# Patient Record
Sex: Female | Born: 1970 | Race: Black or African American | Hispanic: No | Marital: Married | Smoking: Never smoker
Health system: Southern US, Community
[De-identification: ages and names within clinical notes are randomized; demographics above are authoritative.]

## PROBLEM LIST (undated history)

## (undated) DIAGNOSIS — D508 Other iron deficiency anemias: Secondary | ICD-10-CM

## (undated) DIAGNOSIS — I10 Essential (primary) hypertension: Secondary | ICD-10-CM

## (undated) DIAGNOSIS — D649 Anemia, unspecified: Secondary | ICD-10-CM

## (undated) HISTORY — PX: WISDOM TOOTH EXTRACTION: SHX21

## (undated) HISTORY — DX: Other iron deficiency anemias: D50.8

## (undated) HISTORY — PX: OTHER SURGICAL HISTORY: SHX169

## (undated) HISTORY — PX: LAPAROSCOPIC HYSTERECTOMY: SHX1926

---

## 1999-05-08 ENCOUNTER — Inpatient Hospital Stay (HOSPITAL_COMMUNITY): Admission: AD | Admit: 1999-05-08 | Discharge: 1999-05-10 | Payer: Self-pay | Admitting: Obstetrics

## 1999-05-14 ENCOUNTER — Encounter (HOSPITAL_COMMUNITY): Admission: RE | Admit: 1999-05-14 | Discharge: 1999-08-12 | Payer: Self-pay | Admitting: *Deleted

## 2001-04-02 ENCOUNTER — Inpatient Hospital Stay (HOSPITAL_COMMUNITY): Admission: AD | Admit: 2001-04-02 | Discharge: 2001-04-04 | Payer: Self-pay | Admitting: Obstetrics and Gynecology

## 2001-04-06 ENCOUNTER — Encounter: Admission: RE | Admit: 2001-04-06 | Discharge: 2001-05-06 | Payer: Self-pay | Admitting: Obstetrics and Gynecology

## 2001-05-10 ENCOUNTER — Other Ambulatory Visit: Admission: RE | Admit: 2001-05-10 | Discharge: 2001-05-10 | Payer: Self-pay | Admitting: Obstetrics and Gynecology

## 2001-12-28 ENCOUNTER — Encounter: Payer: Self-pay | Admitting: Emergency Medicine

## 2001-12-28 ENCOUNTER — Encounter: Admission: RE | Admit: 2001-12-28 | Discharge: 2001-12-28 | Payer: Self-pay | Admitting: Emergency Medicine

## 2002-05-25 ENCOUNTER — Other Ambulatory Visit: Admission: RE | Admit: 2002-05-25 | Discharge: 2002-05-25 | Payer: Self-pay | Admitting: Obstetrics and Gynecology

## 2002-11-21 ENCOUNTER — Other Ambulatory Visit: Admission: RE | Admit: 2002-11-21 | Discharge: 2002-11-21 | Payer: Self-pay | Admitting: Obstetrics and Gynecology

## 2003-05-27 ENCOUNTER — Encounter: Payer: Self-pay | Admitting: Obstetrics and Gynecology

## 2003-05-27 ENCOUNTER — Inpatient Hospital Stay (HOSPITAL_COMMUNITY): Admission: AD | Admit: 2003-05-27 | Discharge: 2003-05-31 | Payer: Self-pay | Admitting: *Deleted

## 2003-05-28 ENCOUNTER — Encounter (INDEPENDENT_AMBULATORY_CARE_PROVIDER_SITE_OTHER): Payer: Self-pay | Admitting: Specialist

## 2003-12-23 HISTORY — PX: TUBAL LIGATION: SHX77

## 2003-12-23 HISTORY — PX: ABDOMINOPLASTY: SUR9

## 2003-12-23 HISTORY — PX: OTHER SURGICAL HISTORY: SHX169

## 2004-05-17 ENCOUNTER — Inpatient Hospital Stay (HOSPITAL_COMMUNITY): Admission: AD | Admit: 2004-05-17 | Discharge: 2004-05-17 | Payer: Self-pay | Admitting: Obstetrics and Gynecology

## 2004-05-22 ENCOUNTER — Other Ambulatory Visit: Admission: RE | Admit: 2004-05-22 | Discharge: 2004-05-22 | Payer: Self-pay | Admitting: Obstetrics and Gynecology

## 2004-05-22 ENCOUNTER — Other Ambulatory Visit: Admission: RE | Admit: 2004-05-22 | Discharge: 2004-05-22 | Payer: Self-pay | Admitting: Advanced Practice Midwife

## 2004-06-18 ENCOUNTER — Ambulatory Visit (HOSPITAL_COMMUNITY): Admission: RE | Admit: 2004-06-18 | Discharge: 2004-06-18 | Payer: Self-pay | Admitting: Obstetrics and Gynecology

## 2004-07-01 ENCOUNTER — Encounter (HOSPITAL_COMMUNITY): Admission: RE | Admit: 2004-07-01 | Discharge: 2004-07-31 | Payer: Self-pay | Admitting: Obstetrics and Gynecology

## 2004-07-09 ENCOUNTER — Ambulatory Visit (HOSPITAL_COMMUNITY): Admission: RE | Admit: 2004-07-09 | Discharge: 2004-07-09 | Payer: Self-pay | Admitting: Obstetrics and Gynecology

## 2004-08-05 ENCOUNTER — Encounter (HOSPITAL_COMMUNITY): Admission: AD | Admit: 2004-08-05 | Discharge: 2004-09-04 | Payer: Self-pay | Admitting: Obstetrics and Gynecology

## 2004-08-26 ENCOUNTER — Ambulatory Visit (HOSPITAL_COMMUNITY): Admission: RE | Admit: 2004-08-26 | Discharge: 2004-08-26 | Payer: Self-pay | Admitting: Obstetrics and Gynecology

## 2004-09-10 ENCOUNTER — Encounter (HOSPITAL_COMMUNITY): Admission: AD | Admit: 2004-09-10 | Discharge: 2004-09-20 | Payer: Self-pay | Admitting: Obstetrics and Gynecology

## 2004-09-10 ENCOUNTER — Inpatient Hospital Stay (HOSPITAL_COMMUNITY): Admission: AD | Admit: 2004-09-10 | Discharge: 2004-09-10 | Payer: Self-pay | Admitting: Obstetrics and Gynecology

## 2004-09-11 ENCOUNTER — Inpatient Hospital Stay (HOSPITAL_COMMUNITY): Admission: AD | Admit: 2004-09-11 | Discharge: 2004-09-15 | Payer: Self-pay | Admitting: Obstetrics and Gynecology

## 2004-09-13 ENCOUNTER — Ambulatory Visit: Payer: Self-pay | Admitting: Neonatology

## 2004-09-23 ENCOUNTER — Encounter (HOSPITAL_COMMUNITY): Admission: RE | Admit: 2004-09-23 | Discharge: 2004-09-30 | Payer: Self-pay | Admitting: Obstetrics and Gynecology

## 2004-09-23 ENCOUNTER — Ambulatory Visit (HOSPITAL_COMMUNITY): Admission: RE | Admit: 2004-09-23 | Discharge: 2004-09-23 | Payer: Self-pay | Admitting: Obstetrics and Gynecology

## 2004-10-02 ENCOUNTER — Inpatient Hospital Stay (HOSPITAL_COMMUNITY): Admission: AD | Admit: 2004-10-02 | Discharge: 2004-10-08 | Payer: Self-pay | Admitting: Obstetrics and Gynecology

## 2004-10-05 ENCOUNTER — Encounter (INDEPENDENT_AMBULATORY_CARE_PROVIDER_SITE_OTHER): Payer: Self-pay | Admitting: Specialist

## 2004-11-13 ENCOUNTER — Other Ambulatory Visit: Admission: RE | Admit: 2004-11-13 | Discharge: 2004-11-13 | Payer: Self-pay | Admitting: Obstetrics and Gynecology

## 2004-11-26 ENCOUNTER — Ambulatory Visit (HOSPITAL_COMMUNITY): Admission: RE | Admit: 2004-11-26 | Discharge: 2004-11-26 | Payer: Self-pay | Admitting: Obstetrics and Gynecology

## 2004-12-10 ENCOUNTER — Ambulatory Visit (HOSPITAL_COMMUNITY): Admission: RE | Admit: 2004-12-10 | Discharge: 2004-12-10 | Payer: Self-pay | Admitting: Obstetrics and Gynecology

## 2005-01-08 ENCOUNTER — Ambulatory Visit: Payer: Self-pay | Admitting: Hematology & Oncology

## 2005-02-28 ENCOUNTER — Ambulatory Visit: Payer: Self-pay | Admitting: Hematology & Oncology

## 2005-05-23 ENCOUNTER — Ambulatory Visit: Payer: Self-pay | Admitting: Hematology & Oncology

## 2005-09-11 ENCOUNTER — Ambulatory Visit: Payer: Self-pay | Admitting: Hematology & Oncology

## 2006-04-09 IMAGING — US US OB FOLLOW-UP
1 series · 13 of 28 positions shown · non-contrast
Comparison: none

CLINICAL DATA: 32-year-old.  G4 P3 with assigned gestational age of 24 weeks 1 day based on LMP.

[Series 1: unknown · 0.30mm/px · 13 of 51 slices shown]
[im 2/51]
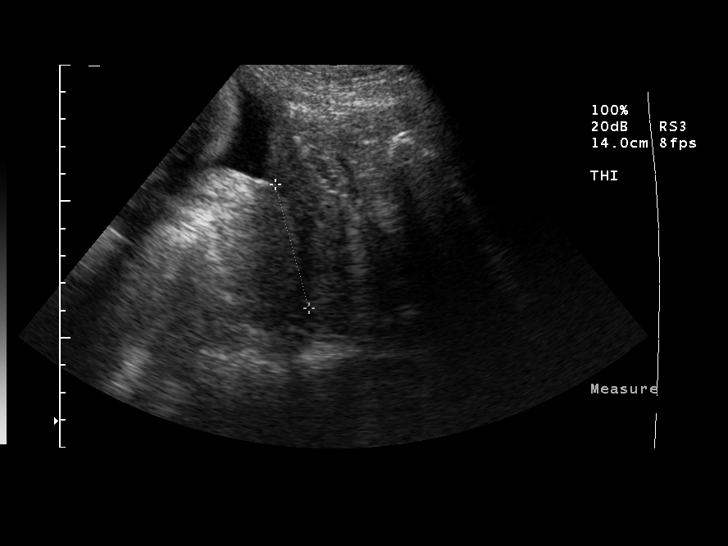
[im 6/51]
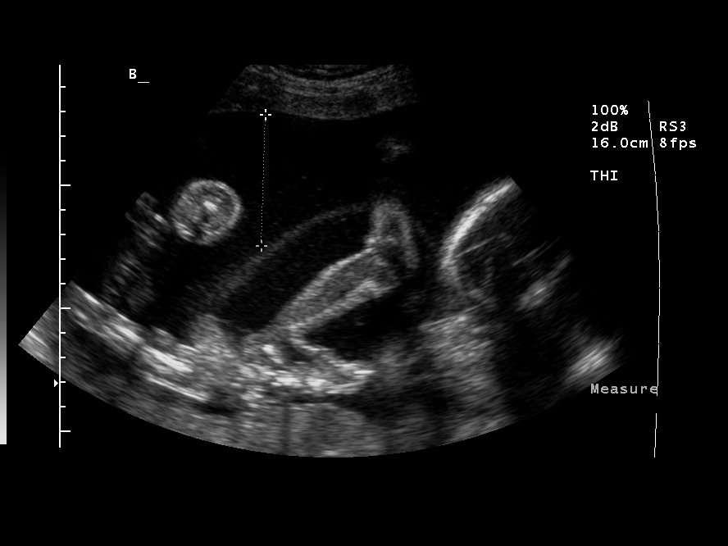
[im 10/51]
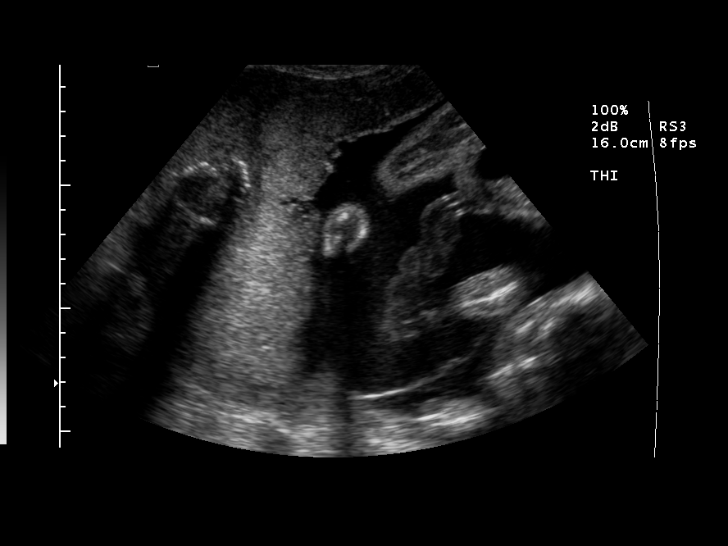
[im 13/51]
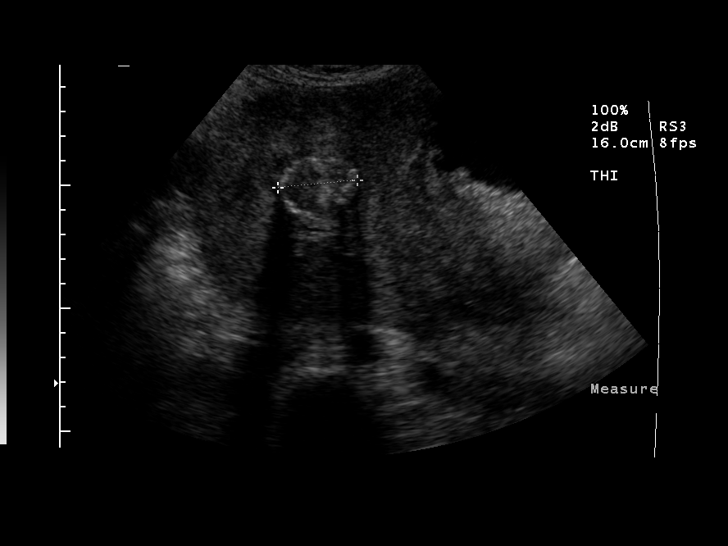
[im 17/51]
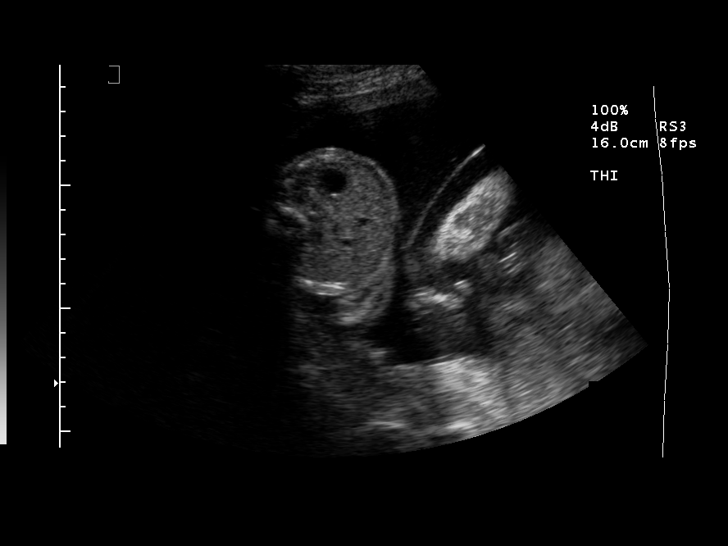
[im 21/51]
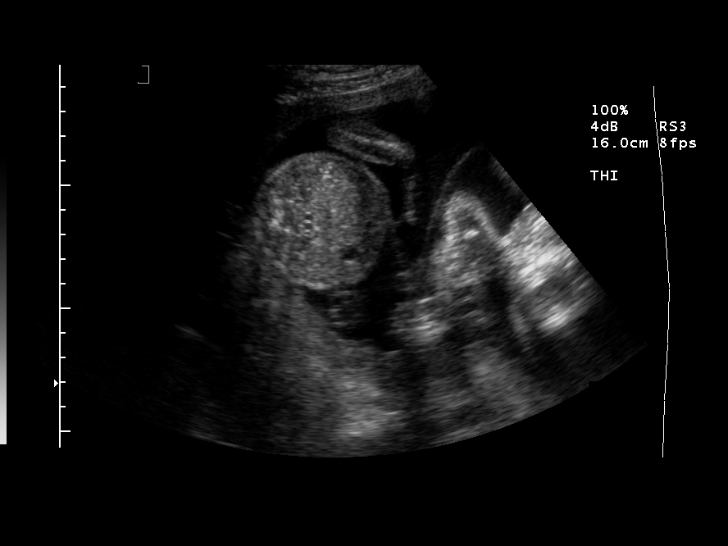
[im 26/51]
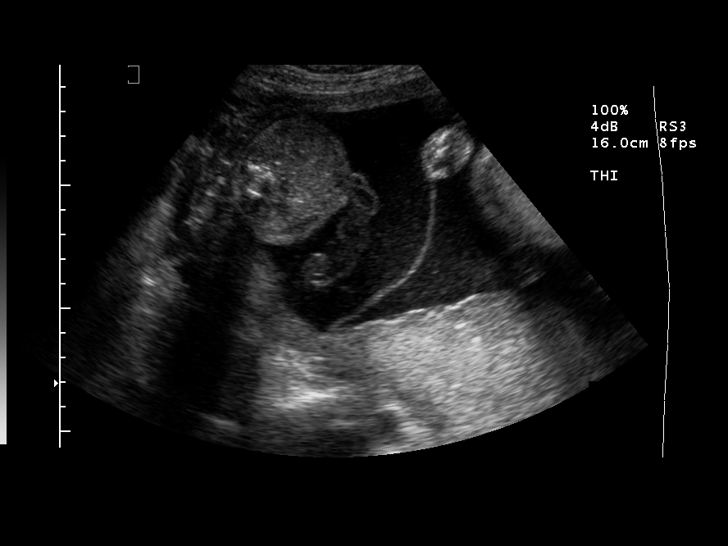
[im 30/51]
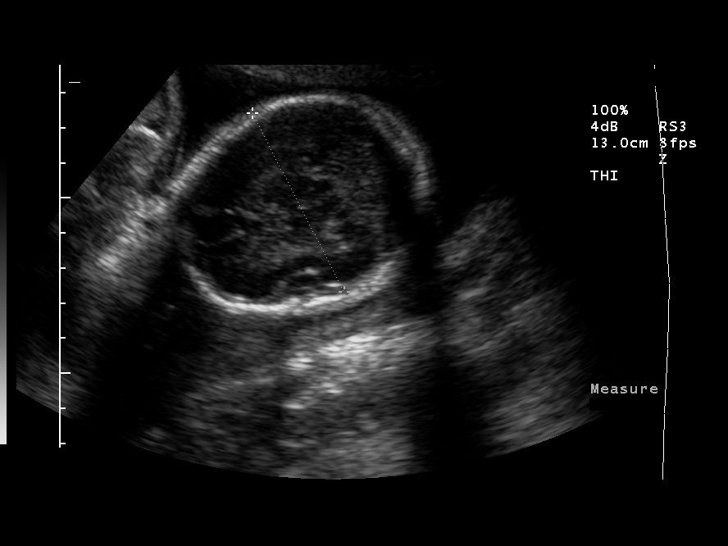
[im 34/51]
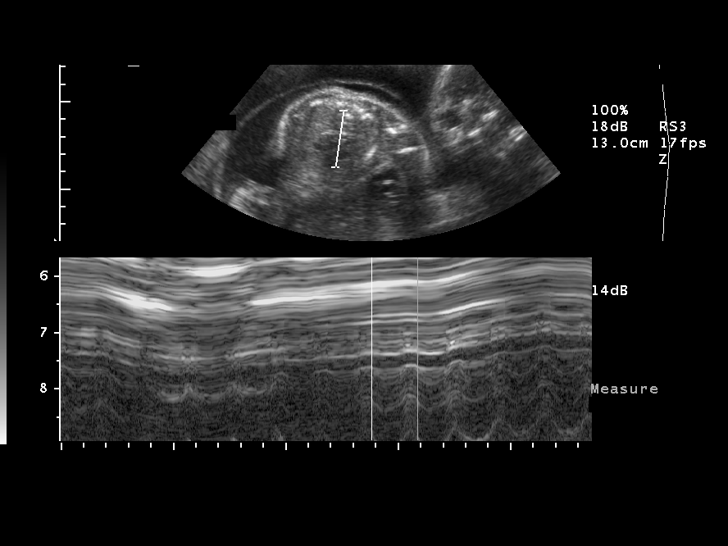
[im 38/51]
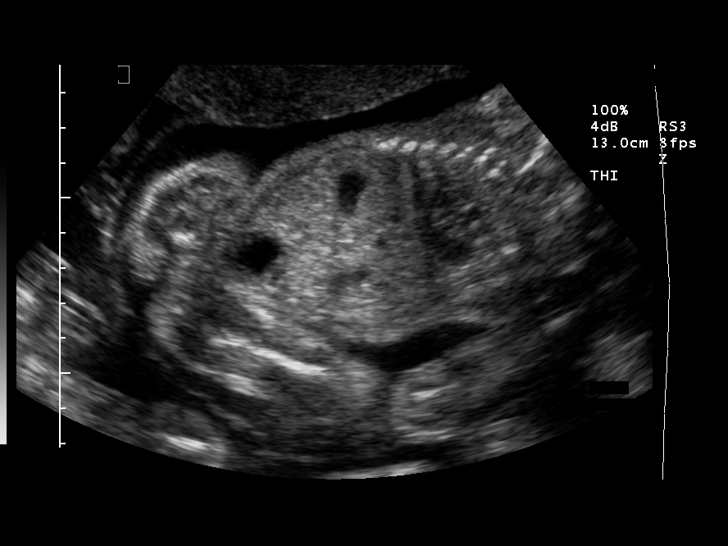
[im 41/51]
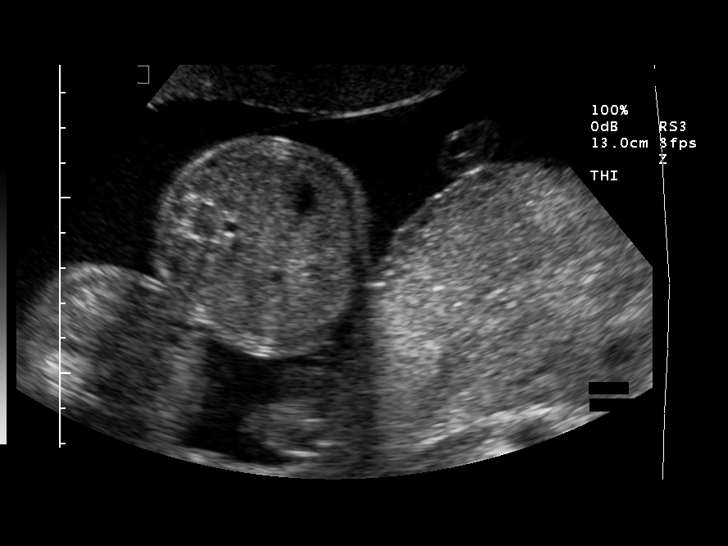
[im 45/51]
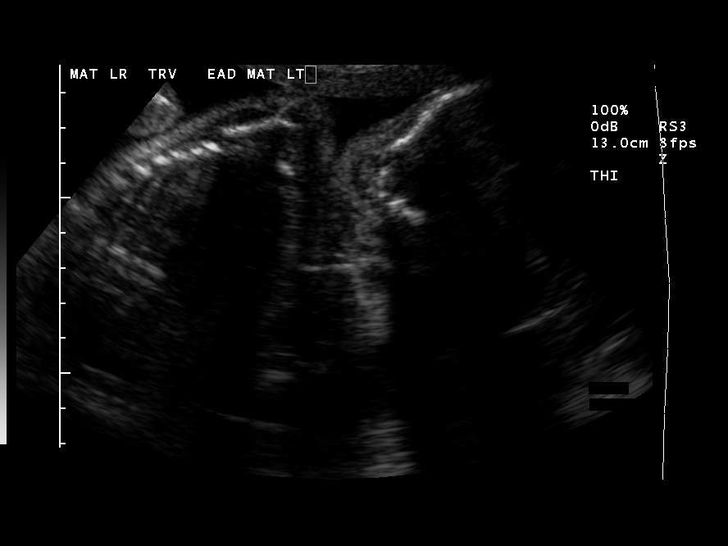
[im 49/51]
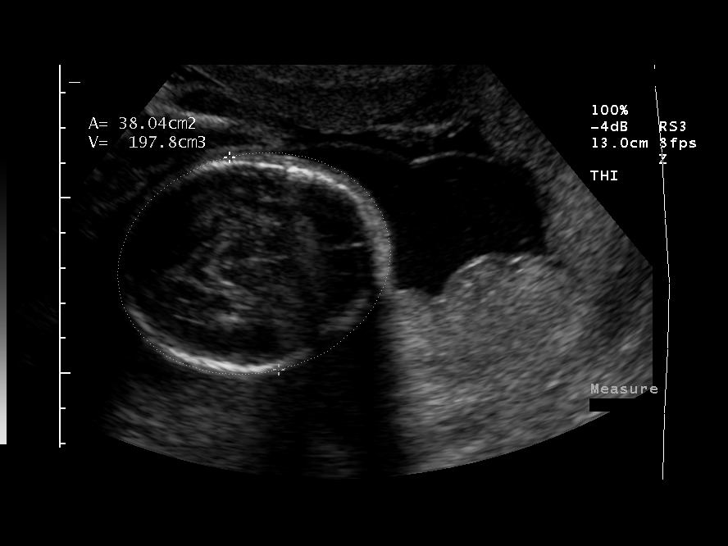

[13 of 28 positions shown; findings below may reference images not displayed]

TWIN OBSTETRICAL ULTRASOUND REEVALUATION:
 A living twin gestation is present.  A separating membrane is seen.

 TWIN A:
 Heart Rate: 158
 Movement:  Yes
 Breathing:  No
 Presentation:  Transverse with head to maternal left 
 Placental Location:  Anterior
 Grade:  I
 Previa:  No
 Amniotic Fluid (Subjective):  Upper normal
 Amniotic Fluid (Objective):   9.5 cm Vertical pocket 

 FETAL BIOMETRY (TWIN A)
 BPD:  5.8 cm   23 w 4 d
 HC:  21.1 cm   23 w 1 d
 AC:  17.6 cm   22 w 4 d
 FL:   4.3 cm   24 w 0 d

 Mean GA:  23 w 2 d
 EFW:  566 g (H)

 FETAL ANATOMY (TWIN A)
 Lateral Ventricles:  Previously seen 
 Thalami/CSP:  Previously seen 
 Posterior Fossa:  Visualized 
 Nuchal Region:  Previously seen 
 Spine:  Previously seen 
 4 Chamber Heart on L:  Previously seen 
 Stomach on Left:  Visualized 
 3 Vessel Cord:  Previously seen 
 Cord Insertion Site:  Visualized 
 Kidneys:  Visualized 
 Bladder:  Visualized 
 Extremities:  Previously seen 

 Additional Anatomy Visualized:  Female genitalia
 TWIN B:
 Heart Rate:  147
 Movement:  Yes
 Breathing:  No
 Presentation:  Transverse with head to maternal left
 Placental Location:  Posterior
 Grade:  I
 Previa:  No
 Amniotic Fluid (Subjective):  Upper normal
 Amniotic Fluid (Objective):   8.0 cm Vertical pocket 

 FETAL BIOMETRY (TWIN B)
 BPD:  5.9 cm   24 w 1 d
 HC:  22.0 cm  24 w 0 d
 AC:  19.7 cm  24 w 3 d
 FL:   4.2 cm   23 w 5 d

 Mean GA:  24 w 1 d
 EFW:  660 g (H)

 FETAL ANATOMY (TWIN B)
 Lateral Ventricles:  Visualized 
 Thalami/CSP:  Previously seen 
 Posterior Fossa:  Previously seen 
 Nuchal Region:  Previously seen 
 Spine:  Previously seen 
 4 Chamber Heart on L:  Previously seen 
 Stomach on Left:  Visualized   
 3 Vessel Cord:  Previously seen 
 Cord Insertion Site:  Visualized 
 Kidneys:  Visualized 
 Bladder:  Visualized 
 Extremities:  Previously seen 

 MATERNAL FINDINGS
 Cervix:  4.6 cm Transabdominally
IMPRESSION: Twin gestation in transverse/transverse presentations.  Estimated fetal weight for fetus A is 566 grams and estimated fetal weight for fetus B is 660 grams.  No fetal anomalies are identified.  Follow-up scans are recommended to evaluated growth.

## 2008-12-05 ENCOUNTER — Ambulatory Visit: Payer: Self-pay | Admitting: Family Medicine

## 2008-12-05 DIAGNOSIS — R5381 Other malaise: Secondary | ICD-10-CM

## 2008-12-05 DIAGNOSIS — D508 Other iron deficiency anemias: Secondary | ICD-10-CM | POA: Insufficient documentation

## 2008-12-05 DIAGNOSIS — R5383 Other fatigue: Secondary | ICD-10-CM

## 2008-12-05 DIAGNOSIS — R519 Headache, unspecified: Secondary | ICD-10-CM | POA: Insufficient documentation

## 2008-12-05 DIAGNOSIS — R51 Headache: Secondary | ICD-10-CM

## 2008-12-06 LAB — CONVERTED CEMR LAB
BUN: 10 mg/dL (ref 6–23)
Basophils Relative: 0.5 % (ref 0.0–3.0)
CO2: 28 meq/L (ref 19–32)
Calcium: 9.1 mg/dL (ref 8.4–10.5)
Cholesterol: 183 mg/dL (ref 0–200)
Creatinine, Ser: 0.8 mg/dL (ref 0.4–1.2)
Eosinophils Absolute: 0.1 10*3/uL (ref 0.0–0.7)
Eosinophils Relative: 1.5 % (ref 0.0–5.0)
GFR calc Af Amer: 105 mL/min
HCT: 31 % — ABNORMAL LOW (ref 36.0–46.0)
Monocytes Absolute: 0.3 10*3/uL (ref 0.1–1.0)
Monocytes Relative: 7.4 % (ref 3.0–12.0)
Neutro Abs: 2.8 10*3/uL (ref 1.4–7.7)
Platelets: 225 10*3/uL (ref 150–400)
Potassium: 3.6 meq/L (ref 3.5–5.1)
RDW: 16.4 % — ABNORMAL HIGH (ref 11.5–14.6)
Total CHOL/HDL Ratio: 4.5
Triglycerides: 74 mg/dL (ref 0–149)
VLDL: 15 mg/dL (ref 0–40)
WBC: 4.8 10*3/uL (ref 4.5–10.5)

## 2009-01-08 ENCOUNTER — Other Ambulatory Visit: Admission: RE | Admit: 2009-01-08 | Discharge: 2009-01-08 | Payer: Self-pay | Admitting: Family Medicine

## 2009-01-08 ENCOUNTER — Encounter: Payer: Self-pay | Admitting: Family Medicine

## 2009-01-08 ENCOUNTER — Ambulatory Visit: Payer: Self-pay | Admitting: Family Medicine

## 2009-01-08 DIAGNOSIS — I10 Essential (primary) hypertension: Secondary | ICD-10-CM | POA: Insufficient documentation

## 2009-01-10 ENCOUNTER — Encounter (INDEPENDENT_AMBULATORY_CARE_PROVIDER_SITE_OTHER): Payer: Self-pay | Admitting: *Deleted

## 2009-03-08 ENCOUNTER — Ambulatory Visit: Payer: Self-pay | Admitting: Family Medicine

## 2009-03-12 LAB — CONVERTED CEMR LAB
BUN: 7 mg/dL (ref 6–23)
GFR calc non Af Amer: 104.7 mL/min (ref >60)
HCT: 32.3 % — ABNORMAL LOW (ref 36.0–46.0)
Hemoglobin: 10.5 g/dL — ABNORMAL LOW (ref 12.0–15.0)
Lymphocytes Relative: 45.3 % (ref 12.0–46.0)
MCV: 85.7 fL (ref 78.0–100.0)
Monocytes Relative: 9.5 % (ref 3.0–12.0)
Neutro Abs: 2.1 10*3/uL (ref 1.4–7.7)
Potassium: 4.5 meq/L (ref 3.5–5.1)
RBC: 3.76 M/uL — ABNORMAL LOW (ref 3.87–5.11)
RDW: 15.7 % — ABNORMAL HIGH (ref 11.5–14.6)
Sodium: 139 meq/L (ref 135–145)

## 2011-01-12 ENCOUNTER — Encounter: Payer: Self-pay | Admitting: Obstetrics and Gynecology

## 2011-04-24 ENCOUNTER — Encounter: Payer: Self-pay | Admitting: Family Medicine

## 2011-04-25 ENCOUNTER — Encounter: Payer: Self-pay | Admitting: Family Medicine

## 2011-04-25 ENCOUNTER — Ambulatory Visit (INDEPENDENT_AMBULATORY_CARE_PROVIDER_SITE_OTHER): Payer: Self-pay | Admitting: Family Medicine

## 2011-04-25 VITALS — BP 132/76 | HR 84 | Temp 98.5°F | Ht 67.5 in | Wt 172.0 lb

## 2011-04-25 DIAGNOSIS — R51 Headache: Secondary | ICD-10-CM

## 2011-04-25 DIAGNOSIS — R509 Fever, unspecified: Secondary | ICD-10-CM

## 2011-04-25 DIAGNOSIS — M549 Dorsalgia, unspecified: Secondary | ICD-10-CM

## 2011-04-25 LAB — POCT URINALYSIS DIPSTICK
Glucose, UA: NEGATIVE
Ketones, UA: NEGATIVE
Leukocytes, UA: NEGATIVE
Nitrite, UA: NEGATIVE
Protein, UA: 30
Spec Grav, UA: 1.03
pH, UA: 6

## 2011-04-25 LAB — CBC WITH DIFFERENTIAL/PLATELET
Eosinophils Absolute: 0 10*3/uL (ref 0.0–0.7)
Eosinophils Relative: 0 % (ref 0.0–5.0)
Monocytes Absolute: 0.6 10*3/uL (ref 0.1–1.0)
Monocytes Relative: 13 % — ABNORMAL HIGH (ref 3.0–12.0)
Neutro Abs: 2.6 10*3/uL (ref 1.4–7.7)
RBC: 3.54 Mil/uL — ABNORMAL LOW (ref 3.87–5.11)
WBC: 4.8 10*3/uL (ref 4.5–10.5)

## 2011-04-25 LAB — COMPREHENSIVE METABOLIC PANEL
ALT: 11 U/L (ref 0–35)
Albumin: 3.9 g/dL (ref 3.5–5.2)
Creatinine, Ser: 0.8 mg/dL (ref 0.4–1.2)
GFR: 101.99 mL/min (ref >60.00)
Glucose, Bld: 85 mg/dL (ref 70–99)
Potassium: 4.2 mEq/L (ref 3.5–5.1)
Sodium: 139 mEq/L (ref 135–145)

## 2011-04-25 NOTE — Progress Notes (Signed)
  Subjective:    Patient ID: Pamela [name], female    DOB: Mar 17, 1971, 40 y.o.   MRN: [id number]  HPI CC: not feeling well  Got sick Tuesday at work (HA, fever, fatigue, abd cramping, back pain).  Started with fever to 102.3.  + abd pain, generalized body aches, L sided migraine with photophobia no nausea (h/o migraines in past).  Took 3 alleve.  Very fatigued.  Constant pains in stomach.  + myalgia and arthralgias.  Alternating fevers/chills, but no high fever again.  Appetite there but eating causes abd pain.  Yesterday able to go to work, abd still very sore.  Today feeling better but still fatigued.  No nausea/vomiting, diarrhea.  No RN, cough, sneezing, ST, upper resp sxs.  Continued L sided head pain intermittently, none currently.  No new rashes.  No confusion, neck stiffness.  + sick contact at work (resident at facility with stomach virus).  Daughter with diarrhea last week as well.  No recent outside exposure.  First week of [place] in [facility] for spring break, no tick bites that she's aware of but is at field a lot.  + 2 dogs (stay in cages).  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  Finished LMP end of last week.  No urinary sxs (no dysuria, urgency, frequency.)  Review of Systems Per HPI    Objective:   Physical Exam  Nursing note and vitals reviewed. Constitutional: She appears well-developed and well-nourished. No distress.  HENT:  Head: Normocephalic and atraumatic.  Nose: Nose normal.  Mouth/Throat: Oropharynx is clear and moist. No oropharyngeal exudate.  Eyes: Conjunctivae and EOM are normal. Pupils are equal, round, and reactive to light. No scleral icterus.  Neck: Normal range of motion. Neck supple. No thyromegaly present.  Cardiovascular: Normal rate, regular rhythm, normal heart sounds and intact distal pulses.   No murmur heard. Pulmonary/Chest: Effort normal and breath sounds normal. No respiratory distress. She has no wheezes. She has no rales.  Abdominal: Soft.  Bowel sounds are normal. She exhibits no distension and no mass. There is tenderness (mild suprapubic). There is no rebound and no guarding.       No CVA tenderness  Lymphadenopathy:    She has no cervical adenopathy.  Neurological: She is alert.  Skin: Skin is warm and dry. No rash noted. No erythema.       No tick bites noted in scalp, throughout body.  Psychiatric: She has a normal mood and affect.          Assessment & Plan:

## 2011-04-25 NOTE — Assessment & Plan Note (Addendum)
To 102, with associated HA, abd pain, back pain, fatigue. ? Flu like illness but no respiratory sxs and rather late in season (although there were some cases in [place] 03[place]).   ? Viral gastro however no GI sxs of n/v/d. No recent tick bites, no new rashes.  Check blood work to eval for hyponatremia, thrombocytopenia and transaminitis consistent with tick born illness. UA with blood and protein, however no significant RBCs on micro.  Sent culture. As seems to be improving, no abx for now.  Await blood work.  Advised to notify us if fever returning or concerns. Viral illness likely as seems to be improving.  Continue to monitor.

## 2011-04-25 NOTE — Assessment & Plan Note (Signed)
Likely return of migraine with above illness.  Monitor for now.  No new meds.

## 2011-04-25 NOTE — Patient Instructions (Signed)
Urine looking overall ok, a bit concentrated so push fluids.  I've sent it in for a culture. Blood work today to check on things. Sounds like a flu-like illness although we're late in the season for this. Update [place] if any worsening, any return of fevers. We will call you with results of blood wok.

## 2011-05-09 NOTE — H&P (Signed)
Pamela [name], Pamela [name]                             ACCOUNT NO.:  [id number]   MEDICAL RECORD NO.:  [id number]                   PATIENT TYPE:  INP   LOCATION:  9160                                 [facility]ACILITY:  [facility]   PHYSICIAN:  Naima A. Dillard, M.D.              DATE OF BIRTH:  Jul 30, 1971   DATE OF ADMISSION:  05/27/2003  DATE OF DISCHARGE:                                HISTORY & PHYSICAL   HISTORY OF PRESENT ILLNESS:  The patient is a 40 year old gravida 3, para 0-  2-0-2, at 37-2/7 weeks who presented with uterine contractions since last  night. She had been 2 to 3 cm in the office. She denied any leaking or  bleeding and reported positive fetal movement. She was checked in maternity  admission unit and cervix was found to be 3 to 4 cm, 80%, vertex at -3, per  RN examination. She ambulated x1 hour.  The cervix then changed to 4, 70%,  with vertex still at -3. Uterine contractions were approximately every four  minutes, but were still very mild.  She was ambulated one more time and then  the cervix advanced to 5 with the vertex at a -2 station. She was therefore  admitted for further labor care secondary to cervical dilatation, distance  from the hospital and multiparity. Pregnancy has been remarkable for:  1)  History of two previous preterm deliveries at 35 to 36 weeks.  2) History of  polyhydramnios with the first pregnancy.  3) History of preterm labor with  both previous pregnancies.  4) History of migraines.  5) Irritable bowel  syndrome.  6) Equivocal rubella.   PRENATAL LABORATORY DATA:  Blood type is O positive, Rh antibody negative,  VDRL nonreactive, rubella titer is equivocal, hepatitis B surface antigen  negative, HIV nonreactive, sickle cell test negative, GC and Chlamydia  cultures were negative. Glucose Challenge was normal. Pap was normal.  Cystic fibrosis testing was negative. AFP was normal.  Group B Strep culture  was negative at 36 weeks.  Repeat RPR was negative  at 28 weeks.  Hemoglobin  upon entry into practice was 10.8.  It was 9.6 at 27 weeks.  EDC of June 15, 2003, was established by last menstrual period and was in agreement with  ultrasound at approximately 18 weeks.   HISTORY OF PRESENT PREGNANCY:  The patient entered care at approximately 7  weeks. She had a positive urine culture which was treated with Macrobid. She  had some early pregnancy nausea and vomiting. She had another ultrasound at  19 weeks which was normal.  She had some contractions at 27 weeks with  normal findings.  She did have a GI virus at 29 weeks.  Her cervix was  closed and long at that time, but was slightly soft.  It was within normal  limits at the next examination.  She had normal fluid at  32 weeks.  She did  have some itchiness of a rash at that same time. She was given Atarax for  itching. The rest of her pregnancy was essentially uncomplicated.   PAST OBSTETRICAL HISTORY:  In 2000 she had a vaginal birth of a female infant,  weight 6 pounds 10 ounces at 35 to 36 weeks. She was in labor less than  three hours. She had no anesthesia. She had preterm labor that began at  approximately 15 weeks. She had an elevated AFP. She was hospitalized  several times for preterm labor. The infant was jaundiced and the patient  did have fibroids.   In [place] of 2002, she had a vaginal birth of a female infant, weight 7  pounds at 35 to [redacted] weeks gestation. She was in labor for 13 hours. She had  epidural anesthesia. She did have preterm labor that began at 28 weeks and  was hospitalized with bed rest. She also had polyhydramnios with that  pregnancy and that child also had jaundice. She had anemia with both of  those pregnancies. She was offered a cerclage in her 2002 pregnancy, but she  declined.  She did have polyhydramnios in the 2002 pregnancy and preterm  labor and birth with both of her pregnancies.   PAST MEDICAL HISTORY:  She was diagnosed with fibroids in the past.   She was  on oral contraceptives until July of 2003.  Reports the usual childhood  diseases.  She has been anemic all of her life. She was diagnosed with  irritable bowel syndrome in August of 2002.  She had a urinary tract  infection in 2003.  She has occasional migraines.   PAST SURGICAL HISTORY:  Wisdom teeth in 1995 and bunionectomy right and left  in 1994.  Her only other hospitalization was for childbirth.   ALLERGIES:  No known drug allergies.   FAMILY HISTORY:  Her paternal grandfather had heart disease. Father,  paternal aunts, and paternal uncles have chronic hypertension. Her son has  some degree of reactive airway disease.  Maternal grandmother, father, and  paternal aunt are non-insulin dependent diabetics.  Maternal aunt has  thyroid problems.  Paternal aunt had breast cancer.  Paternal grandmother  had breast cancer.  Maternal grandmother, paternal grandmother, and paternal  grandfather all had strokes.  Her son does have seizures.  Maternal  grandmother has Alzheimer's.  Genetic history is unremarkable.   SOCIAL HISTORY:  The patient is married to the father of the baby. He is  involved and supportive. His name is Nilda Riggs. He is not currently with  her today, but he has been involved and supportive. Her mother is with her  today. The patient is African-American and of the [facility] faith. She has  been followed by the certified nurse midwife service at [facility].  She denies any alcohol, drug, or tobacco use during this pregnancy.   PHYSICAL EXAMINATION:  VITAL SIGNS:  Stable. The patient is afebrile.  HEENT:  Within normal limits.  LUNGS:  Bilateral breath sounds are clear.  HEART:  Regular rate and rhythm without murmur.  BREASTS: Soft and nontender.  ABDOMEN:  Fundal height is approximately 37 cm.  Estimated fetal weight 7  pounds.  Uterine contractions are every three to four minutes of mild to  moderate quality. PELVIC:  Cervix 5 cm, 60%, vertex at  -2 station.  Fetal heart rate was  reassuring, but was having some sporadic variables with late recovery noted.  These have  now resolved with artificial rupture of membranes.  Fetal heart  rate was tracing reactivity in maternity admission unit.  EXTREMITIES:  Deep tendon reflexes are 2+ without clonus. There is a trace  edema noted.   IMPRESSION:  1. Intrauterine pregnancy at 37-2/7 weeks.  2. Early labor.  3. Negative Group B Strep.  4. Equivocal rubella.   PLAN:  1. Admit to birthing suite per consult with Dr. Normand Sloop as attending     physician.  2. Routine certified nurse midwife orders.  3. Will plan artificial range of motion when able and augment as needed.  4. Pain medication per patient request.     Chip Boer L. Emilee Hero, C.N.M.                   Naima A. Normand Sloop, M.D.    Leeanne Mannan  D:  05/27/2003  T:  05/27/2003  Job:  [id number]

## 2011-05-09 NOTE — Op Note (Signed)
NAMEFREDERICKA, Pamela [name]                             ACCOUNT NO.:  [id number]   MEDICAL RECORD NO.:  [id number]                   PATIENT TYPE:  INP   LOCATION:  9136                                 [facility]ILITY:  [facility]   PHYSICIAN:  Naima A. Dillard, M.D.              DATE OF BIRTH:  November 03, 1971   DATE OF PROCEDURE:  05/28/2003  DATE OF DISCHARGE:                                 OPERATIVE REPORT   PREOPERATIVE DIAGNOSES:  1. Oblique lie with malpresentation.  2. Active labor at 38 weeks.   POSTOPERATIVE DIAGNOSES:  1. Transverse lie.  2. Active labor at 38 weeks.   PROCEDURE:  Primary low transverse cesarean section.   SURGEON:  Naima A. Normand Sloop, M.D.   ASSISTANT:  Renaldo Reel. Emilee Hero, C.N.M.   ANESTHESIA:  Epidural.   ESTIMATED BLOOD LOSS:  300 mL.   INTRAVENOUS FLUIDS:  1700 mL.   URINE OUTPUT:  600 mL.   There were no complications.   FINDINGS:  A female infant in transverse presentation born at 2:51 with a  weight of 7 pounds 10 ounces, Apgars 6 and 8, and a cord pH of 7.29.  There  was clear fluid.  A true knot in the umbilical cord was noted with body and  nuchal cord.  Normal uterus, tubes, and ovaries and abdominal anatomy.   PROCEDURE IN DETAIL:  The patient was taken to the operating room, where her  epidural anesthesia was felt to be adequate.  She was placed in the dorsal  supine position with a left lateral tilt.  A Pfannenstiel skin incision was  then made with a scalpel and carried down to the fascia using Bovie cautery.  The fascia was then incised in the midline and extended bilaterally using  Mayo scissors.  Kochers x2 were placed on the superior aspect of the fascia,  which was dissected off the rectus muscle both sharply with Mayo scissors  and bluntly.  Attention was turned to the inferior aspect of the fascia,  which was separated from the rectus muscle in a similar fashion.  The rectus  muscle was separated in the midline.  The peritoneum was identified,  tented  up, and entered sharply with Metzenbaum scissors.  The bladder blade was  inserted and the vesicouterine peritoneum was identified and tented up and  entered sharply and extended bilaterally using Metzenbaum scissors.  The  bladder blade was reinserted.  A primary lower transverse uterine incision  was then made with the scalpel.  Upon inspection, I am not able to feel the  infant's head in the pelvis and an arm is appreciated in the incision.  The  arm was replaced.  When I went to check for the infant's head, it was to the  maternal left and the baby was completely transverse.  At this time I tried  to convert the baby to a vertex and deliver the head,  but this was unable to  be done.  I then grabbed both of the baby's feet and delivered the baby  using breech maneuvers.  Nuchal cord x1 was easily reduced.  The infant was  handed off to the awaiting pediatricians.  Cord was clamped and cut.  Cord  gas was sent.  The placenta was manually delivered.  The uterus was cleared  of all clot and debris with a sponge.  The uterus was closed with 0 Vicryl,  a second layer of 0 Vicryl was used for imbrication.  There were still some  bleeding areas which had to be made hemostatic using figure-of-eight  stitches of 0 Vicryl.  Irrigation was then done to the abdomen.  Hemostasis  was noted.  Findings noted above were seen.  The peritoneum was closed with  0 chromic.  The subcutaneous tissue was irrigated and any bleeding areas  were made hemostatic with Bovie cautery.  Skin was closed with staples.  Sponge, lap, and needle counts were correct x2.  The patient was returned to  the recovery room in stable condition.                                                Naima A. Normand Sloop, M.D.    NAD/MEDQ  D:  05/28/2003  T:  05/28/2003  Job:  [id number]

## 2011-05-09 NOTE — Discharge Summary (Signed)
[name], [name]                 ACCOUNT NO.:  [id number]   MEDICAL RECORD NO.:  [id number]          PATIENT TYPE:  INP   LOCATION:                                [facility]   PHYSICIAN:  Osborn Coho, M.D.   DATE OF BIRTH:  1971-09-19   DATE OF ADMISSION:  10/02/2004  DATE OF DISCHARGE:  10/08/2004                                 DISCHARGE SUMMARY   ADMISSION DIAGNOSES:  1.  Intrauterine pregnancy at 29-2/7 weeks.  2.  Twin gestation.  3.  Preterm labor.  4.  Anemia.   DISCHARGE DIAGNOSES:  1.  Intrauterine pregnancy at 29-2/7 weeks.  2.  Twin gestation.  3.  Preterm labor.  4.  Anemia.  5.  Failed tocolysis, active labor, status post repeat low transverse      cesarean section, breast feeding, and the infant is in the Neonatal      Intensive Care Unit.   PROCEDURES THIS ADMISSION:  Repeat low transverse cesarean section for  delivery of twins on 10/05/2004 by Dr. Osborn Coho and Concha Pyo.  Duplantis, C.N.M.   HOSPITAL COURSE:  Ms. Pamela [name] is a 40 year old, married, black female, gravida  4, para 1-2-0-3 and 29-2/7 weeks who had been followed at home on Materia  monitoring secondary to frequent uterine contractions.  She has had negative  fetal fibronectins; however, had increased uterine contractions on October 02, 2004 and was admitted for tocolysis due to no response to Procardia or  terbutaline.  She responded slowly to magnesium sulfate therapy that  unfortunately did not change her cervix initially.  However, on October 05, 2004 she had increased discomfort throughout the night and had received  Stadol IV a couple of times for pain; however, her pain continued to  increase and between 10 a.m. and 1 p.m., she had dilated from 1-2 cm  initially all of the way to 8 cm.  She was then prepared for cesarean  section delivery STAT and underwent the same for the delivery of two viable  female infants.  Twin A weighed 3 pounds 1 ounce and had Apgar's of 7 and 9,  delivered at 11:54 a.m.  Twin B weighed 3 pounds 5.6 ounces, had Apgar's of  5 and 7 and was delivered at 11:57 a.m. and both were attended in delivery  by Dr. Osborn Coho and Concha Pyo. Duplantis, C.N.M.  Please see operative  note for further details.  Postoperatively, the patient has done well.  She  is ambulating, voiding, and eating without difficulty at this time.  She  does describe some shortness of breath with exertion but no dizziness or  syncope.  She was febrile initially following delivery and continued febrile  through the afternoon of October 06, 2004 but has not had any fever since 4  p.m. on October 16.  She is breast pumping and has a good supply.  Her  infants started doing well in the NICU and are stable.  She is deemed ready  for discharge today.  Her discharge instructions are as per the [facility] handout  and her discharge labs, her blood cultures are negative.  Her urine culture  is still pending.  Her hemoglobin is 7.0, WBC count is 14.3, platelets are  222,000 and her discharge follow up will be in four weeks at [facility] with Dr. Su Hilt or p.r.n.   DISCHARGE MEDICATIONS:  1.  Motrin 600 p.o. q.6h. p.r.n. for pain.  2.  Tylox 1-2 p.o. q.4-6h. p.r.n. for pain.  3.  Prenatal vitamins daily.  4.  Ferrous sulfate b.i.d.  5.  Colace b.i.d.   DISCHARGE STATUS:  Well and stable.     Shel   SJD/MEDQ  D:  10/08/2004  T:  10/08/2004  Job:  [id number]

## 2011-05-09 NOTE — H&P (Signed)
[facility]  Patient:    Pamela [name], Pamela [name]                         MRN: [id number] Adm. Date:  [id number] Attending:  Lenoard Aden CC:         [facility]   History and Physical  CHIEF COMPLAINT:              Labor.  HISTORY OF PRESENT ILLNESS:   This is a 39 year old female, G2, P11, EDD of Apr 24, 2001, at 37 weeks in active labor.  PAST OBSTETRICAL HISTORY:     Uncomplicated vaginal delivery, 6 pound 14 ounce female, May 2000.  ALLERGIES:                    No known drug allergies.  MEDICATIONS:                  Prenatal vitamins.  PAST MEDICAL HISTORY:         Otherwise remarkable for chronic anemia with normal hemoglobin, electrophoresis and foot surgery.  FAMILY HISTORY:               Breast cancer and hypertension.  PRENATAL HISTORY:             Complicated by preterm labor and polyhydramnios which on most recent ultrasound had resolved.  PERTINENT LABORATORY DATA:    Blood type O positive. Rh antibody negative. Rubella immune.  Hepatitis B surface antigen negative.  HIV nonreactive and GBS performed is negative.  PHYSICAL EXAMINATION:  GENERAL:                      On physical examination she is a well-developed, well-nourished black female in no apparent distress.  HEENT:                        Normal.  LUNGS:                        Clear.  HEART:                        Regular rate and rhythm.  ABDOMEN:                      Soft, gravida, nontender.  Estimated fetal weight 7 pounds.  PELVIC:                       Cervix is 4-5 cm, 50%, vertex -2.  EXTREMITIES:                  Reveal no cords.  NEUROLOGICAL:                 Examination is nonfocal.  IMPRESSION:                   Term, intrauterine pregnancy in active labor.  PLAN:                         Pitocin augmentation if necessary.  Epidural anticipate.  Attempts at vaginal delivery. DD:  04/02/01 TD:  04/02/01 Job: [id number]

## 2011-05-09 NOTE — Discharge Summary (Signed)
Pamela [name], Pamela [name]                 ACCOUNT NO.:  [id number]   MEDICAL RECORD NO.:  [id number]          PATIENT TYPE:  INP   LOCATION:  9156                          [facility]ILITY:  [facility]   PHYSICIAN:  Naima A. Dillard, M.D. DATE OF BIRTH:  1971-05-23   DATE OF ADMISSION:  09/11/2004  DATE OF DISCHARGE:  09/15/2004                                 DISCHARGE SUMMARY   ADMITTING DIAGNOSES:  1.  Intrauterine pregnancy at 44 and three-sevenths weeks.  2.  Preterm contractions.  3.  Twin gestation.   DISCHARGE DIAGNOSES:  1.  Intrauterine pregnancy at 27 weeks.  2.  Twin gestation.  3.  Preterm contractions, stable with no cervical change.  4.  Anemia.   Pamela [name] is a 40 year old gravida 4 para 3-1-2-0-3 at 35 and three-sevenths  weeks who presented to the hospital with twin gestation for her weekly  Delalutin injection and complete OB ultrasound.  She had been seen on  September 10, 2004 in [facility]U for preterm contractions.  Her fetal fibronectin  was found to be negative at that time and she had no cervical change.  Her  cervix has been closed and it remained closed.  She received betamethasone  12.5 mg injection on September 10, 2004 at 1830 and received a second dose  of betamethasone on September 11, 2004 at 1830.  On presenting to [facility] for her weekly Delalutin injection she reported  positive fetal movement x2, irregular contractions, and abdominal  tightening.  Fetal heart rates both were reassuring with accelerations.  The  patient was contracting every 1-2 minutes mildly for 30 seconds with some  uterine irritability and these contractions did space out.  Ultrasound exam  of the babies found twin A estimated fetal weight 50-75%, twin B estimated  fetal weight 75-90%.  There is a posterior placenta grade 1 and a dividing  membrane is seen.  AFI for twin A is 3.8 cm, AFI for twin B is 5.8 cm, and  cervix measures 4.4 cm.  On return from ultrasound, the patient  continued  contracting mildly with uterine irritability noted.  She was therefore  admitted for tocolysis with magnesium sulfate.  She has done well since  admission and her contractions were able to be ceased and the magnesium  stopped.  However, she was still contracting and began using p.o.  terbutaline.  She requested terbutaline pump which was placed by [facility].  However, the pump caused side effects and the patient requested that be  discontinued.  She has been stable since admission.  Her contractions are  very sporadic at the present time.  Her cervix remains closed and 50%.  She  had workup for anemia during this admission.  Her hemoglobin was noted to be  7.6.  Her urine was clear.  She had an iron level of 327, TIBC 468, ferritin  was 9, and transferrin 361.  Hemoglobin P was identified on electrophoresis  and she is to have a hematology consult.  She is taking iron b.i.d. and will  return to the office this week for  evaluation.  She has begun on p.o.  terbutaline to take every 4 hours to help  with contractions.  She is to take Motrin 600 mg p.o. q.6h. until 28 weeks.  She is to remain on bedrest level 2 and continue weekly Delalutin  injections.  She will call for any signs or symptoms of preterm labor or any  problems or concerns.  She will continue Motrin 600 mg until 28 weeks.      SDM/MEDQ  D:  09/15/2004  T:  09/16/2004  Job:  [id number]

## 2011-05-09 NOTE — Discharge Summary (Signed)
   Pamela [name], Pamela [name]                             ACCOUNT NO.:  [id number]   MEDICAL RECORD NO.:  [id number]                   PATIENT TYPE:  INP   LOCATION:  9136                                 [facility]   PHYSICIAN:  Naima A. Dillard, M.D.              DATE OF BIRTH:  September 27, 1971   DATE OF ADMISSION:  05/27/2003  DATE OF DISCHARGE:  05/31/2003                                 DISCHARGE SUMMARY   ADMITTING DIAGNOSES:  1. Intrauterine pregnancy at term.  2. Early labor.   PREOPERATIVE DIAGNOSES:  Oblique lie with malpresentation.   PROCEDURE:  Primary low transverse cesarean section.   DISCHARGE DIAGNOSES:  1. Intrauterine pregnancy at term.  2. Early labor.  3. Oblique lie with malpresentation.  4. Transverse lie.   HISTORY OF PRESENT ILLNESS:  The patient is a 40 year old gravida 3, para 0-  2-0-2 who presents at 37-2/7 weeks in early active labor.  Her labor was  characterized by malpresentation.  External cephalic version was  unsuccessful and decision was made for primary low transverse cesarean  section by Naima A. Dillard, M.D. with the birth of a 7 pound 10 ounce female  infant named [name] with Apgar scores of 6 at one minute, 8 at five  minutes.  Cord pH of 7.29.  Both patient and infant have done well in the  immediate postoperative period.  Hemoglobin on the first postoperative day  was 8.2.  Vital signs have remained stable.  The patient has not become  orthostatic and on this her third postoperative day she is judged to be in  satisfactory condition for discharge.   DISCHARGE INSTRUCTIONS:  Per [facility] handout.   DISCHARGE MEDICATIONS:  1. Motrin 600 mg p.o. q.6h. p.r.n. pain.  2. Tylox one to two p.o. q.3-4h. p.r.n. pain.  3. Prenatal vitamins.   DISCHARGE FOLLOWUP:  At [facility] in six weeks.     Rica Koyanagi, C.N.M.               Naima A. Normand Sloop, M.D.    SDM/MEDQ  D:  05/31/2003  T:  05/31/2003  Job:  [id number]

## 2011-05-09 NOTE — Op Note (Signed)
NAMERAYEL, SANTIZO                 ACCOUNT NO.:  [id number]   MEDICAL RECORD NO.:  [id number]          PATIENT TYPE:  AMB   LOCATION:  SDC                           [facility]   PHYSICIAN:  Osborn Coho, M.D.   DATE OF BIRTH:  [birth year]   DATE OF PROCEDURE:  11/26/2004  DATE OF DISCHARGE:                                 OPERATIVE REPORT   PREOPERATIVE DIAGNOSES:  1.  Desires permanent sterilization.  2.  Multiparity.   POSTOPERATIVE DIAGNOSES:  1.  Desires permanent sterilization.  2.  Multiparity.   PROCEDURE:  Laparoscopic tubal sterilization via fulguration.   ANESTHESIA:  General.   ATTENDING PHYSICIAN:  Osborn Coho, M.D.   FLUIDS:  2000 mL.   ESTIMATED BLOOD LOSS:  Minimal (less than 10 mL).   URINE OUTPUT:  Straight cath prior to procedure.   COMPLICATIONS:  None.   FINDINGS:  Normal appearing bilateral ovaries and fallopian tubes with  bilateral venous congestion of the mesosalpinx.   DESCRIPTION OF PROCEDURE:  The patient was taken to the operating room after  the risks, benefits, and alternatives were discussed with the patient. The  patient verbalized understanding and consent signed and witnessed. The  patient was placed under general anesthesia and prepped and draped in the  usual sterile fashion in the dorsal lithotomy position.  A hulka was placed  in the uterus for intrauterine manipulation.  On examination under  anesthesia prior to prepping the patient, the uterus was approximately 10-12  week size with positive bleeding as the patient is on her menstrual cycle.  A 10 mm horizontal incision was made at the umbilicus and carried down to  the underlying layer of fascia which was excised and the peritoneum entered  bluntly.  #0 Vicryl sutures were placed at the angles of the fascia and the  Hasson placed into the intraabdominal cavity.  Laparoscope was introduced  and pneumoperitoneum achieved.  The fallopian tubes were bilaterally carried  out  to their fimbriated ends and findings as noted above. Fulguration was  performed with at least three consecutive burns in the isthmic portion of  the bilateral fallopian tubes. Pneumoperitoneum was released.  The fascia  was repaired with #0 Vicryl initially placed at  the angles of the fascia upon entry with an additional interrupted figure of  eight stitch placed in the mid portion of the fascial incision.  Sponge, lap  and needle count was correct. The patient tolerated the procedure well and  is awaiting transfer to the recovery room.     Ange   AR/MEDQ  D:  11/26/2004  T:  11/27/2004  Job:  [id number]

## 2011-05-09 NOTE — H&P (Signed)
NAMEKENSLEE, [name]                 ACCOUNT NO.:  [id number]   MEDICAL RECORD NO.:  [id number]          PATIENT TYPE:  INP   LOCATION:  9153                          [facility]   PHYSICIAN:  Naima A. Dillard, M.D. DATE OF BIRTH:  1971-04-20   DATE OF ADMISSION:  10/02/2004  DATE OF DISCHARGE:                                HISTORY & PHYSICAL   HISTORY OF PRESENT ILLNESS:  Pamela [name] is a 40 year old, gravida 4, para 1-2-  0-3, at 53 and 2/7ths weeks who presented from home with increased uterine  contractions on [place] home monitoring.  She reported the uterine  contractions were 6/10 in intensity, with onset approximately 1:30 a.m.  She  denies any leaking or bleeding, and reports positive fetal movement.  The  pregnancy has been remarkable for (1) twin gestation with previous triplets  with fetal demise of one twin in the first trimester, (2) history of preterm  labor with preterm delivery x2 at approximately 35 weeks.  The patient has  been on Delalutin during this pregnancy q week on Mondays, (3) previous  cesarean section with two spontaneous vaginal births (the cesarean section  was done secondary to transverse lie); the patient desires VBAC this time,  (4) fibroids, (5) Delalutin every Monday.  She has also been on terbutaline  2.5 mg p.o. q.3h., but was intolerant of the terbutaline pump secondary to  tachycardia.  She had a fetal fibronectin that was negative on September 30, 2004, and received a two dose course of betamethasone on approximately  September 10, 2004.  She has been on pelvic rest since early pregnancy.  Cervix was long, closed, and soft on last exam, September 30, 2004.   PRENATAL LABORATORIES:  The prenatal labs were actually drawn today.  The  patient's blood type is O positive.  Sickle cell test negative from previous  pregnancies.  She had ASCUS on her Pap smear in early pregnancy.  GC and  Chlamydia cultures were negative on September 10, 2004.  Hemoglobin  today is  8.9, hematocrit 26.9, white blood cell count 8.2, and platelets are 226.  Hepatitis B surface antigen, RPR, rubella are all pending.   HISTORY OF PRESENT PREGNANCY:  The patient entered care at approximately 10  weeks.  She had had an evaluation on May 17, 2004 for gastroenteritis.  At  that time, a triplet intrauterine pregnancy was found with a demise of  triplet C.  She had an ultrasound in early pregnancy for cervix which was  within normal limits.  She did have ASCUS on Pap.  HPV testing was done that  was negative.  She was having some __________.  The issues of incompetent  cervix was discussed with the patient by Dr. Normand Sloop.  The patient began  Delalutin at approximately 16 weeks.  Cerclage was not recommended by Dr.  Sherrie George in consult.  Cervical length was 3.1 cm at 17 weeks.  She had another  ultrasound at 21 weeks that showed normal growth and development.  Cervix  was 3.3 cm.  She had another ultrasound at 24 weeks  showing concordant  growth.  Fetal fibronectin was done on September 10, 2004 secondary to  increased contractions.  The cervix was fingertip, long, and high.  She was  sent to the maternity admissions unit for monitoring Delalutin and  betamethasone.  She was placed on home monitoring.  She was intolerant of  the terbutaline pump secondary to elevated pulse rate.  She was placed on  terbutaline 2.5 mg p.o. q.3h.   OBSTETRICAL HISTORY:  In 2000, she had a vaginal birth of a female infant that  weighed 6 pounds, 10 ounces at 35 and 4/7ths weeks.  She was in labor less  than 3 hours.  She had no complications.  During that pregnancy, she did  have preterm labor beginning at 16 weeks.  She had an elevated AFP and  jaundice.  In 2002, she had a vaginal birth of a female infant that weighed  7 pounds at 35 weeks.  She was in labor 13 hours.  She had an epidural  anesthesia.  Preterm labor occurred at 28 weeks.  She had polyhydramnios and  jaundice.  In June of  2004, she had a primary low transverse cesarean  section for a female with a weight of 7 pounds, 10 ounces, at 37 and 2/7ths  weeks.  She had epidural anesthesia.  That child was in a transverse oblique  lie and had a true knot in the cord.  The patient had anemia with both  previous pregnancies, and has been on iron this time.  Does have a history  of polyhydramnios with her second pregnancy.  She did have preterm labor  with two previous pregnancies.   MEDICAL HISTORY:  1.  Oral contraceptives were used in the past.  2.  She reports the usual childhood illnesses.  3.  She does have a history of fibroids.  4.  She has occasional yeast infections.  5.  She has had anemia all her life.  6.  She has occasional migraines.  7.  She has a history of irritable bowel syndrome, but no further follow up.   SURGICAL HISTORY:  1.  In 1995, wisdom teeth removed.  2.  In 1994, she had bunion surgery done bilaterally.   Her only other hospitalization was for childbirth.   GENETIC HISTORY:  Unremarkable.   FAMILY HISTORY:  Maternal father, paternal aunt, and paternal grandfather  had varicosities.  The patient's son had a febrile seizure.  Paternal aunt  and paternal grandmother had breast cancer.  Maternal grandmother, maternal  father, and paternal aunt all had diabetes.  Paternal aunt also had thyroid  problems.   SOCIAL HISTORY:  The patient is married to the father of the baby.  He is  involved and supportive.  His name is Nilda Riggs.  The patient is [place], and of the [employer].  She has been followed by the  physician's service at [facility].  She denies any alcohol, drug,  or tobacco use during this pregnancy.  She has 3 years of college.  She is  employed as a Futures trader.  Her partner has a Naval architect.  He is  employed as a Transport planner.   PHYSICAL EXAMINATION:  VITAL SIGNS:  Stable.  Pulse is 119.  HEENT:  Within normal limits. LUNGS:  Breath  sounds are clear.  HEART:  Regular rate and rhythm without murmur.  BREASTS:  Soft and nontender.  ABDOMEN:  Fundal height is approximately 32 cm.  Fetal A appears  to be  breech by bedside ultrasound.  Fetus B appears to be vertex.  PELVIC:  Cervical exam is closed, long, and soft.  The presenting part is  that of pelvis.  Fetal heart rate is reactive with no decelerations.  EXTREMITIES:  Deep tendon reflexes are 2+ without clonus.  There is trace  edema noted.   IMPRESSION:  1.  Intrauterine twin pregnancy at 74 and 2/7ths weeks.  2.  Preterm labor refractory to one dose of Procardia given in MAU.  3.  Anemia.   PLAN:  1.  Birthing suite consult with Dr. Jaymes Graff as attending physician.  2.  Routine physician orders.  3.  Plan magnesium sulfate therapy with 4 gm bolus and 2 gm per hour.  4.  Continuous electronic fetal monitoring.  5.  Iron supplementation.  6.  Continuous electronic fetal monitoring.  7.  Plan to check magnesium level in the morning.      VLL/MEDQ  D:  10/02/2004  T:  10/02/2004  Job:  [id number]

## 2011-05-09 NOTE — Op Note (Signed)
Pamela [name], Pamela [name]                 ACCOUNT NO.:  [id number]   MEDICAL RECORD NO.:  [id number]          PATIENT TYPE:  INP   LOCATION:  9153                          [facility]   PHYSICIAN:  Osborn Coho, M.D.   DATE OF BIRTH:  January 04, 1971   DATE OF PROCEDURE:  10/05/2004  DATE OF DISCHARGE:                                 OPERATIVE REPORT   PREOPERATIVE DIAGNOSES:  1.  29-5/7 weeks twins.  2.  Preterm labor.  3.  Malpresentation.   POSTOPERATIVE DIAGNOSES:  1.  29-5/7 weeks twins.  2.  Preterm labor.  3.  Malpresentation.   PROCEDURES:  Repeat cesarean section.   ANESTHESIA:  Spinal.   ATTENDING:  Osborn Coho, M.D.   ASSISTANT:  Concha Pyo. Duplantis, C.N.M.   FLUIDS:  2300 mL.   ESTIMATED BLOOD LOSS:  1500 mL.   URINE OUTPUT:  300 mL.   COMPLICATIONS:  None.   FINDINGS:  Live female infant twin A with Apgars of 7 at one minute and 8 at  five minutes.  Cord gases were venous 7.38 and arterial 7.32.  Live female  infant twin B with Apgars of 5 at one minute and 7 at five minutes.  Cord  gases were arterial 7.33 and venous unable to be obtained.  Placenta to  pathology.   DESCRIPTION OF PROCEDURE:  The patient was taken to the operating room for  emergent C-section.  The patient was found to be dilated to 8 cm.  The  risks, benefits and alternatives of C-section were discussed with the  patient.  The patient was given a spinal per anesthesia and prepped and  draped.  The patient was given a Pfannenstiel skin incision at the part of  the prior scar.  The skin incision was carried down to the underlying layer  of fascia with the scalpel.  The fascia was excised bilaterally in the  midline and the fascia was extended bilaterally with the Mayo scissors.  Kocher clamps were placed on the inferior aspect of the fascial incision and  the rectus muscle excised from the fascia.  The same was done on the  superior aspect of the fascial incision.  The peritoneum was  entered and  manually extended.  A bladder blade was placed.  The bladder flap was  created with the Metzenbaum scissors.  The uterine incision was made with  scalpel and extended bilaterally with the bandage scissors.  Membranes of  both infants were noted to be intact.  The membranes of twin A were ruptured  and breech extraction performed without difficulty.  The cord was clamped  designating twin A and infant handed off to the awaiting pediatricians.  Membranes of twin B were then ruptured and there was some difficulty trying  to get the bilateral lower extremities.  Ultimately the bilateral lower  extremities were able to be reached and the infant was delivered via breech  extraction.  The cord was clamped designating twin B and the infant handed  to the awaiting pediatricians.  The placentas were removed via fundal  massage and the uterus  cleared of all clots and debris.  The uterine  incision was repaired with 0 Vicryl in a running lock fashion and a second  imbricating layer was performed.  Two figure-of-eight stitches were placed  for further hemostasis at the uterine incision.  The intra-abdominal cavity  was copiously irrigated.  The bilateral ovaries and fallopian tubes appeared  normal.  The peritoneum was repaired with 3-0 chromic in a running fashion.  The fascia was repaired with 0 Vicryl in a running fashion.  The  subcutaneous tissues were irrigated and made hemostatic with the Bovie.  A  subcuticular stitch was performed to reapproximate the skin using 3-0  Monocryl.  The sponge, laparotomy and needle counts were correct.  The  patient tolerated the procedure well and is awaiting return to the recovery  room.      AR/MEDQ  D:  10/05/2004  T:  10/05/2004  Job:  [id number]

## 2011-05-09 NOTE — H&P (Signed)
[name], [name]                 ACCOUNT NO.:  [id number]   MEDICAL RECORD NO.:  [id number]          PATIENT TYPE:  INP   LOCATION:  9156                          FACILITY:  [facility]   PHYSICIAN:  Naima A. Dillard, M.D. DATE OF BIRTH:  1971-06-14   DATE OF ADMISSION:  09/11/2004  DATE OF DISCHARGE:                                HISTORY & PHYSICAL   HISTORY OF PRESENT ILLNESS:  Pamela [name] is a 40 year old gravida 4 para 1-2-0-  3 at 65 and three-sevenths weeks; EDD December 16, 2004 with twin gestation  who presents for her weekly Delalutin injection and complete OB ultrasound  with complaint of preterm contractions.  She was seen last p.m. in [facility] for  preterm contractions.  Her fetal fibronectin was negative at that time and  her cervix is long and closed.  She did receive betamethasone 12.5 mg  injection at 1830 yesterday and is due to receive the second injection at  1830 today.  She does report positive fetal movement x2, no vaginal  bleeding, no rupture of membranes.  She is, however, having irregular  contractions and abdominal tightening, and these are increasing in  intensity.  She denies any PIH symptoms; no headache, visual changes, or  epigastric pain.  In light of the fact that contractions have not ceased in  several hours, decision has been made to admit the patient for tocolysis  with magnesium sulfate to treat preterm contractions.  This patient's  pregnancy has been followed by the M.D. service at [facility] and is remarkable  for:  1.  Twin gestation.  2.  Fibroids.  3.  History preterm labor and preterm delivery x2 at 35 weeks.  4.  History of migraine headaches.  5.  Irritable bowel syndrome.  6.  Previous C-section.   This patient was initially evaluated at the office of [facility] on May 22, 2004  at approximately [redacted] weeks gestation.  Her pregnancy was initially evaluated  by ultrasound when she was seen in the ER for gastroenteritis.  At that  time, a triple intrauterine  pregnancy was noted with demise of triplet C.  The patient has continued a twin gestation without difficulty.  She has been  counseled early in pregnancy regarding her history of preterm labor versus  incompetent cervix and the patient has agreed to weekly Delalutin injections  prophylactically for prevention of preterm labor.  Her pregnancy has been  complicated by nausea and vomiting in the first and second trimester.  Ultrasound examinations of the babies have been within normal limits.  Placenta is anterior, grade 1, with no previa, and a membrane is seen.  On  today's ultrasound examination, twin A is in the maternal right in a  transverse position with her head on the maternal left.  Fetal heart rate is  140 with positive fetal movement and positive fetal breathing.  Amniotic  fluid index is 3.8 cm.  Estimated fetal weight is at the 50th to 75th  percentile for 26 weeks.  Cervical evaluation finds the cervix to be 4.4 cm.  Twin B has a heart rate  of 137 with positive fetal movement, no fetal  breathing noted.  Twin B is in a transverse position in the maternal left  posterior with the head in the maternal left.  Amniotic fluid index is 5.8  cm with estimated fetal weight at the 75th to 90th percentile.   PRENATAL LABORATORY DATA:  On May 22, 2004:  Blood type O positive.  Sickle  cell trait negative.  Pap smear showed ASCUS with HPV for high-risk types  negative.  CF testing negative.  GC and chlamydia negative.  CBC today on  September 11, 2004 shows hemoglobin of 8.4; hematocrit 24.9; and platelets  244,000.  The wbc's are at 19.5.  Differential will be ordered.   OBSTETRICAL HISTORY:  In May 2000 the patient had a normal spontaneous  vaginal delivery with the birth of a 6-pound 10-ounce female infant at 35  weeks with preterm labor, increased AFP, and jaundice.  The baby's name is  Pamela [name].  In [place] 2002 the patient had a normal spontaneous vaginal delivery  with birth of a 7-pound  0-ounce female infant named Pamela [name].  The patient  experienced preterm labor at 28 weeks, polyhydramnios, and the baby had  jaundice at birth.  In June 2004 the patient had a normal spontaneous  vaginal delivery at 37 weeks with the birth of a 7-pound 10-ounce female  infant named Pamela [name].  The patient delivered by primary low transverse  cesarean section.  The baby was transverse oblique with a true knot in the  cord.  The baby did well with no complications.   MEDICAL HISTORY:  The patient has a history of anemia, a history of  polyhydramnios in a previous pregnancy, as well as preterm labor and  delivery x2.  The patient has occasional yeast infections and did have  chicken pox as a child.  She has a history of irritable bowel syndrome,  fibroid tumors.  She has occasional migraine headaches.   SURGICAL HISTORY:  Primary C-section, wisdom teeth, bunion removal from left  foot.   FAMILY HISTORY:  Paternal grandmother with a history of heart disease.  The  patient's father, paternal aunt, and paternal grandmother with a history of  chronic hypertension.  The patient's maternal aunt and maternal grandmother  have breast cancer.  The patient has a history of anemia.  Her son has  asthma.  Diabetes is present in the patient's maternal grandmother, father,  paternal aunt.  Paternal aunt has thyroid disease.   GENETIC HISTORY:  Unremarkable.   The patient has no known drug allergies.  She denies the use of tobacco,  alcohol, or illicit drugs.   REVIEW OF SYSTEMS:  Are as described above.  The patient is 26 weeks 3 days  with a twin gestation having preterm contractions.   PHYSICAL EXAMINATION:  VITAL SIGNS:  Stable, afebrile.  HEENT:  Unremarkable.  HEART:  Regular rate and rhythm.  LUNGS:  Clear.  ABDOMEN:  Gravid in its contour.  Uterine fundus is noted to extend 28 cm above the level of the pubic symphysis.  Leopold's maneuvers find the  infants to be in a transverse presentation.   Twin A with head in the  maternal left and twin B also with head in the maternal left.  The patient  is contracting every 1-2 minutes lasting 30-40 seconds.  These contractions  are mild to palpation.  She is experiencing uterine irritability in-between  contractions. Digital exam of the cervix finds it to be a fingertip dilated,  long, and presenting part of the fetus high in the pelvis.  Abdomen is soft  and nontender.  Negative CVA tenderness.  EXTREMITIES:  Show no pathologic edema.  DTRs are 1+ with no clonus.   ASSESSMENT:  1.  Intrauterine pregnancy at 22 and three-sevenths weeks.  2.  Preterm contractions.  3.  Tocolysis with magnesium sulfate.   PLAN:  Admit per Dr. Jaymes Graff with orders as written.  The plan was  discussed with the patient and she verbalizes her understanding and  agreement for admission and tocolysis.      SDM/MEDQ  D:  09/11/2004  T:  09/11/2004  Job:  [id number]

## 2011-05-22 ENCOUNTER — Ambulatory Visit (INDEPENDENT_AMBULATORY_CARE_PROVIDER_SITE_OTHER): Payer: Self-pay | Admitting: Family Medicine

## 2011-05-22 ENCOUNTER — Encounter: Payer: Self-pay | Admitting: Family Medicine

## 2011-05-22 DIAGNOSIS — D508 Other iron deficiency anemias: Secondary | ICD-10-CM

## 2011-05-22 DIAGNOSIS — G43909 Migraine, unspecified, not intractable, without status migrainosus: Secondary | ICD-10-CM

## 2011-05-22 MED ORDER — PROPRANOLOL HCL ER 80 MG PO CP24: 80.0000 mg | ORAL_CAPSULE | Freq: Every day | ORAL | Status: DC

## 2011-05-22 MED ORDER — SUMATRIPTAN SUCCINATE 50 MG PO TABS: 50.0000 mg | ORAL_TABLET | ORAL | Status: DC | PRN

## 2011-05-22 NOTE — Patient Instructions (Signed)
F/u 1 month 

## 2011-05-22 NOTE — Progress Notes (Signed)
40 year old female:  Significantly anemic:  Lab Results  Component Value Date   WBC 4.8 04/25/2011   HGB 8.7 Repeated and verified X2.* 04/25/2011   HCT 27.1* 04/25/2011   MCV 76.4* 04/25/2011   PLT 330.0 04/25/2011   Headaches are worse. Has very heavy periods. Headaches will alternate.  Since twins in 2005, stopped taking her iron about four days ago.  Feels weak and faint sometimes. Sometimes will feel tired.   Migraines:  Headaches, now essentially every day. On the 18th felt horribly bad, felt like she was goin gto cry. Could not stop it. The light was hurting her eyes. Usually on the right side. When bad, will get some nausea.  I gave some Inderral in the office previously, but she never filled. Imitrex has worked well for her in the past.  Patient Active Problem List  Diagnoses  . OTHER SPECIFIED IRON DEFICIENCY ANEMIAS  . HYPERTENSION  . FATIGUE  . Migraine headache   Past Medical History  Diagnosis Date  . Unspecified essential hypertension   . Other specified iron deficiency anemias   . Migraine    Past Surgical History  Procedure Date  . Cesarean section [id number]  . Abdominoplasty 2005  . Breast lift 2005  . Tubal ligation 2005   History  Substance Use Topics  . Smoking status: Never Smoker   . Smokeless tobacco: Not on file  . Alcohol Use: No   Family History  Problem Relation Age of Onset  . Arthritis      family history  . Prostate cancer Father   . Diabetes      1st degree relative  . Hypertension      family history   Allergies  Allergen Reactions  . Sulfa Antibiotics    Current Outpatient Prescriptions on File Prior to Visit  Medication Sig Dispense Refill  . IRON PO Take 1 tablet by mouth 2 (two) times daily.       Marland Kitchen DISCONTD: isometheptene-acetaminophen-dichloralphenazone (MIDRIN) 65-325-100 MG per capsule Take 1-2 capsules by mouth every 4 (four) hours as needed.        Marland Kitchen DISCONTD: propranolol (INDERAL LA) 80 MG 24 hr capsule Take 80 mg by  mouth daily.        Marland Kitchen DISCONTD: SUMAtriptan (IMITREX) 50 MG tablet Take 50 mg by mouth every 2 (two) hours as needed.         ROS: GEN: improving after recent acute illness Ha above GI: occ nausea with HA, eating normally Pulm: No SOB Interactive and getting along well at home.  Otherwise, ROS is as per the HPI.   Physical Exam  Blood pressure 124/80, pulse 81, temperature 98.1 F (36.7 C), temperature source Oral, height 5' 7.5" (1.715 m), weight 178 lb (80.74 kg), last menstrual period 04/17/2011, SpO2 97.00%.  GEN: WDWN, NAD, Non-toxic, A & O x 3 HEENT: Atraumatic, Normocephalic. Neck supple. No masses, No LAD. Ears and Nose: No external deformity. CV: RRR, No M/G/R. No JVD. No thrill. No extra heart sounds. PULM: CTA B, no wheezes, crackles, rhonchi. No retractions. No resp. distress. No accessory muscle use. EXTR: No c/c/e NEURO Normal gait.  PSYCH: Normally interactive. Conversant. Not depressed or anxious appearing.  Calm demeanor.   A/P: Migraine, unstable, start B-blocker, imitrex prn. Recheck 1 mo 2. Iron def anemia, heavy periods, increase iron to tid

## 2011-07-02 ENCOUNTER — Ambulatory Visit: Payer: Self-pay | Admitting: Family Medicine

## 2012-02-09 ENCOUNTER — Ambulatory Visit (INDEPENDENT_AMBULATORY_CARE_PROVIDER_SITE_OTHER): Payer: Self-pay | Admitting: Family Medicine

## 2012-02-09 ENCOUNTER — Telehealth: Payer: Self-pay | Admitting: Radiology

## 2012-02-09 ENCOUNTER — Encounter: Payer: Self-pay | Admitting: Family Medicine

## 2012-02-09 VITALS — BP 140/92 | HR 104 | Temp 98.9°F | Ht 67.5 in | Wt 180.1 lb

## 2012-02-09 DIAGNOSIS — R1030 Lower abdominal pain, unspecified: Secondary | ICD-10-CM

## 2012-02-09 DIAGNOSIS — D259 Leiomyoma of uterus, unspecified: Secondary | ICD-10-CM

## 2012-02-09 DIAGNOSIS — D219 Benign neoplasm of connective and other soft tissue, unspecified: Secondary | ICD-10-CM

## 2012-02-09 DIAGNOSIS — D508 Other iron deficiency anemias: Secondary | ICD-10-CM

## 2012-02-09 DIAGNOSIS — R109 Unspecified abdominal pain: Secondary | ICD-10-CM

## 2012-02-09 LAB — CBC WITH DIFFERENTIAL/PLATELET
Basophils Absolute: 0 10*3/uL (ref 0.0–0.1)
Hemoglobin: 6.9 g/dL — CL (ref 12.0–15.0)
MCHC: 29.2 g/dL — ABNORMAL LOW (ref 30.0–36.0)
MCV: 69 fl — ABNORMAL LOW (ref 78.0–100.0)
Monocytes Absolute: 0.7 10*3/uL (ref 0.1–1.0)
Neutro Abs: 3.3 10*3/uL (ref 1.4–7.7)
Neutrophils Relative %: 56.7 % (ref 43.0–77.0)
RBC: 3.45 Mil/uL — ABNORMAL LOW (ref 3.87–5.11)
RDW: 19.4 % — ABNORMAL HIGH (ref 11.5–14.6)

## 2012-02-09 LAB — BASIC METABOLIC PANEL
Calcium: 9 mg/dL (ref 8.4–10.5)
GFR: 93.52 mL/min (ref >60.00)
Glucose, Bld: 76 mg/dL (ref 70–99)
Potassium: 3.7 mEq/L (ref 3.5–5.1)

## 2012-02-09 LAB — HEPATIC FUNCTION PANEL
AST: 12 U/L (ref 0–37)
Albumin: 4 g/dL (ref 3.5–5.2)
Alkaline Phosphatase: 46 U/L (ref 39–117)
Bilirubin, Direct: 0 mg/dL (ref 0.0–0.3)
Total Protein: 8 g/dL (ref 6.0–8.3)

## 2012-02-09 NOTE — Telephone Encounter (Signed)
Noted  CBC:    Component Value Date/Time   WBC 5.7 02/09/2012 1150   HGB 6.9* 02/09/2012 1150   HCT 23.8* 02/09/2012 1150   PLT 394.0 02/09/2012 1150   MCV 69.0* 02/09/2012 1150   NEUTROABS 3.3 02/09/2012 1150   LYMPHSABS 1.8 02/09/2012 1150   MONOABS 0.7 02/09/2012 1150   EOSABS 0.0 02/09/2012 1150   BASOSABS 0.0 02/09/2012 1150

## 2012-02-09 NOTE — Patient Instructions (Signed)
REFERRAL: GO THE THE FRONT ROOM AT THE ENTRANCE OF OUR CLINIC, NEAR CHECK IN. ASK FOR MARION. SHE WILL HELP YOU SET UP YOUR REFERRAL. DATE: TIME:  

## 2012-02-09 NOTE — Telephone Encounter (Signed)
[facility] called critical results , Hgb 6.9, Hcy 23.8. They are doing a manuel blood smear. Heather to page Dr Patsy Lager.

## 2012-02-09 NOTE — Progress Notes (Signed)
Patient Name: Pamela [name] Date of Birth: [birth year] Age: 41 y.o. Medical Record Number: [id number] Gender: female Date of Encounter: 02/09/2012  History of Present Illness:  Pamela [name] is a 41 y.o. very pleasant female patient who presents with the following:  Sat -- went to work, then was walking down the hallway and had some pain on her left side. Let it go for a while, then it started to get worse, and had a hard time walking. Got where she could not really walk and family had to get her. They had to carry her in her house, and all was on the left side. Was freezing cold most of the evening and then got hot. Felt like she was burning up on the inside. Back started to hurt, then on Sunday had no pain. Occaisionally, had a sharp pain in her stomache occ.   Now really tired. Also happened about 6-9 months ago, got really cold and felt cold and achy back then.   Now she is dramatically better, and has only minimal amount of pain in her abdomen. She has been afebrile throughout this time. She is eating okay right now. She does have a history of a bilateral tubal ligation distantly. Also has a history of a tummy tuck distantly.  Married, no STD exposure.   04/25/2011 OV with Dr. Reece Agar Got sick Tuesday at work (HA, fever, fatigue, abd cramping, back pain).  Started with fever to 102.3.  + abd pain, generalized body aches, L sided migraine with photophobia no nausea (h/o migraines in past).  Took 3 alleve.  Very fatigued.  Constant pains in stomach.  + myalgia and arthralgias.  Alternating fevers/chills, but no high fever again.  Appetite there but eating causes abd pain.  Yesterday able to go to work, abd still very sore.  Today feeling better but still fatigued.  No nausea/vomiting, diarrhea.  No RN, cough, sneezing, ST, upper resp sxs.  Continued L sided head pain intermittently, none currently.  No new rashes.  No confusion, neck stiffness.  + sick contact at work (resident at facility with  stomach virus).  Daughter with diarrhea last week as well.  No recent outside exposure.  First week of [employer] in [employer] [facility] for spring break, no tick bites that she's aware of but is at field a lot.  + 2 dogs (stay in cages).  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  Finished LMP end of last week.  No urinary sxs (no dysuria, urgency, frequency.)   Past Medical History, Surgical History, Social History, Family History, Problem List, Medications, and Allergies have been reviewed and updated if relevant.  Review of Systems: ROS: GEN: Acute illness details above GI: as above GU: maintaining adequate hydration and urination Pulm: No SOB Interactive and getting along well at home. Otherwise, the pertinent positives and negatives are listed above and in the HPI, otherwise a full review of systems has been reviewed and is negative unless noted positive.   Physical Examination: Filed Vitals:   02/09/12 1109  BP: 140/92  Pulse: 104  Temp: 98.9 F (37.2 C)  TempSrc: Oral  Height: 5' 7.5" (1.715 m)  Weight: 180 lb 1.9 oz (81.702 kg)  SpO2: 100%    Body mass index is 27.79 kg/(m^2).   GEN: WDWN, NAD, Non-toxic, A & O x 3 HEENT: Atraumatic, Normocephalic. Neck supple. No masses, No LAD. Ears and Nose: No external deformity. CV: RRR, No M/G/R. No JVD. No thrill. No extra heart sounds. ABD: soft,  mild tenderness in the LEFT greater than RIGHT bilateral lower quadrants. Nontender in the upper quadrants. No rebound tenderness. No hepatosplenomegaly. Positive bowel sounds. Nondistended. PULM: CTA B, no wheezes, crackles, rhonchi. No retractions. No resp. distress. No accessory muscle use. EXTR: No c/c/e NEURO Normal gait.  PSYCH: Normally interactive. Conversant. Not depressed or anxious appearing.  Calm demeanor.    Assessment and Plan: 1. Abdominal pain, lower  CBC with Differential, Basic metabolic panel, Hepatic function panel, US Abdomen Complete, US Transvaginal Non-OB, US Pelvis Complete    2. Other specified iron deficiency anemias  CBC with Differential    Abdominal pain, faint sensations, and other symptoms without a clear cause. We'll obtain some laboratory panels, and initially start with a pelvic and abdominal ultrasound.  Also the patient had some significant anemia a number of months ago, and we'll need to follow this up today.  At the time of this dictation, the patient's CBC returned with a hemoglobin of 6.9. We'll work this up until we have a source or cause. I've also ordered a ferritin and iron-binding panel. We'll obtain stool cards.  If the ultrasound is negative, then I think would also likely will need to do a CT of the abdomen and pelvis.  CBC:    Component Value Date/Time   WBC 5.7 02/09/2012 1150   HGB 6.9* 02/09/2012 1150   HCT 23.8* 02/09/2012 1150   PLT 394.0 02/09/2012 1150   MCV 69.0* 02/09/2012 1150   NEUTROABS 3.3 02/09/2012 1150   LYMPHSABS 1.8 02/09/2012 1150   MONOABS 0.7 02/09/2012 1150   EOSABS 0.0 02/09/2012 1150   BASOSABS 0.0 02/09/2012 1150     Comprehensive Metabolic Panel:    Component Value Date/Time   NA 141 02/09/2012 1150   K 3.7 02/09/2012 1150   CL 109 02/09/2012 1150   CO2 27 02/09/2012 1150   BUN 15 02/09/2012 1150   CREATININE 0.9 02/09/2012 1150   GLUCOSE 76 02/09/2012 1150   CALCIUM 9.0 02/09/2012 1150   AST 12 02/09/2012 1150   ALT 12 02/09/2012 1150   ALKPHOS 46 02/09/2012 1150   BILITOT 0.1* 02/09/2012 1150   PROT 8.0 02/09/2012 1150   ALBUMIN 4.0 02/09/2012 1150

## 2012-02-09 NOTE — Telephone Encounter (Signed)
Spoke with Dr. Patsy Lager and he will take care of this in the morning

## 2012-02-10 LAB — FERRITIN: Ferritin: 5.9 ng/mL — ABNORMAL LOW (ref 10.0–291.0)

## 2012-02-11 ENCOUNTER — Ambulatory Visit
Admission: RE | Admit: 2012-02-11 | Discharge: 2012-02-11 | Disposition: A | Discharge status: Home / Self Care | Payer: Self-pay | Source: Ambulatory Visit | Attending: Family Medicine | Admitting: Family Medicine

## 2012-02-11 DIAGNOSIS — R1030 Lower abdominal pain, unspecified: Secondary | ICD-10-CM

## 2012-02-12 ENCOUNTER — Telehealth: Payer: Self-pay

## 2012-02-12 NOTE — Telephone Encounter (Signed)
Pt called about recent lab results. Herbert Seta said Dr Patsy Lager still reviewing lab results and Herbert Seta will call with results when she receives them. Pt said OK.

## 2012-02-13 ENCOUNTER — Other Ambulatory Visit: Payer: Self-pay | Admitting: Family Medicine

## 2012-02-13 DIAGNOSIS — Z129 Encounter for screening for malignant neoplasm, site unspecified: Secondary | ICD-10-CM

## 2012-02-13 MED ORDER — FERROUS GLUCONATE 325 (36 FE) MG PO TABS: 1.0000 | ORAL_TABLET | Freq: Two times a day (BID) | ORAL | Status: DC

## 2012-02-13 NOTE — Progress Notes (Signed)
ifob kit left at the front desk for patient to pick up. Patient notified today and was agreeable.

## 2012-02-13 NOTE — Progress Notes (Signed)
Addended by: Hannah Beat on: 02/13/2012 12:58 PM   Modules accepted: Orders

## 2012-02-13 NOTE — Progress Notes (Signed)
Addended by: Alvina Chou on: 02/13/2012 03:45 PM   Modules accepted: Orders

## 2012-02-19 ENCOUNTER — Other Ambulatory Visit: Payer: Self-pay | Admitting: Obstetrics and Gynecology

## 2012-03-01 ENCOUNTER — Encounter (HOSPITAL_COMMUNITY): Payer: Self-pay

## 2012-03-01 ENCOUNTER — Encounter (HOSPITAL_COMMUNITY)
Admission: RE | Admit: 2012-03-01 | Discharge: 2012-03-01 | Disposition: A | Discharge status: Home / Self Care | Payer: BC Managed Care – PPO | Source: Ambulatory Visit | Attending: Obstetrics and Gynecology | Admitting: Obstetrics and Gynecology

## 2012-03-01 ENCOUNTER — Other Ambulatory Visit: Payer: Self-pay | Admitting: Obstetrics and Gynecology

## 2012-03-01 DIAGNOSIS — D649 Anemia, unspecified: Secondary | ICD-10-CM | POA: Insufficient documentation

## 2012-03-01 MED ORDER — FERUMOXYTOL INJECTION 510 MG/17 ML
510.0000 mg | Freq: Once | INTRAVENOUS | Status: AC
  Administered 2012-03-01: 510 mg via INTRAVENOUS

## 2012-03-01 MED ORDER — SODIUM CHLORIDE 0.9 % IV SOLN
Freq: Once | INTRAVENOUS | Status: AC
  Administered 2012-03-01: 09:00:00 via INTRAVENOUS

## 2012-03-01 NOTE — Discharge Instructions (Signed)
Ferumoxytol injection What is this medicine? FERUMOXYTOL is an iron complex. Iron is used to make healthy red blood cells, which carry oxygen and nutrients throughout the body. This medicine is used to treat iron deficiency anemia in people with chronic kidney disease. This medicine may be used for other purposes; ask your health care provider or pharmacist if you have questions. What should I tell my health care provider before I take this medicine? They need to know if you have any of these conditions: -anemia not caused by low iron levels -high levels of iron in the blood -magnetic resonance imaging (MRI) test scheduled -an unusual or allergic reaction to iron, other medicines, foods, dyes, or preservatives -pregnant or trying to get pregnant -breast-feeding How should I use this medicine? This medicine is for infusion into a vein. It is given by a health care professional in a hospital or clinic setting. Talk to your pediatrician regarding the use of this medicine in children. Special care may be needed. Overdosage: If you think you've taken too much of this medicine contact a poison control center or emergency room at once. Overdosage: If you think you have taken too much of this medicine contact a poison control center or emergency room at once. NOTE: This medicine is only for you. Do not share this medicine with others. What if I miss a dose? It is important not to miss your dose. Call your doctor or health care professional if you are unable to keep an appointment. What may interact with this medicine? This medicine may interact with the following medications: -other iron products This list may not describe all possible interactions. Give your health care provider a list of all the medicines, herbs, non-prescription drugs, or dietary supplements you use. Also tell them if you smoke, drink alcohol, or use illegal drugs. Some items may interact with your medicine. What should I watch  for while using this medicine? Visit your doctor or healthcare professional regularly. Tell your doctor or healthcare professional if your symptoms do not start to get better or if they get worse. You may need blood work done while you are taking this medicine. You may need to follow a special diet. Talk to your doctor. Foods that contain iron include: whole grains/cereals, dried fruits, beans, or peas, leafy green vegetables, and organ meats (liver, kidney). What side effects may I notice from receiving this medicine? Side effects that you should report to your doctor or health care professional as soon as possible: -allergic reactions like skin rash, itching or hives, swelling of the face, lips, or tongue -breathing problems -changes in blood pressure -feeling faint or lightheaded, falls -fever or chills -flushing, sweating, or hot feelings -swelling of the ankles or feet Side effects that usually do not require medical attention (Report these to your doctor or health care professional if they continue or are bothersome.): -diarrhea -headache -nausea, vomiting -stomach pain This list may not describe all possible side effects. Call your doctor for medical advice about side effects. You may report side effects to FDA at 1-800-FDA-1088. Where should I keep my medicine? This drug is given in a hospital or clinic and will not be stored at home. NOTE: This sheet is a summary. It may not cover all possible information. If you have questions about this medicine, talk to your doctor, pharmacist, or health care provider.  2012, [employer]vier/Gold Standard. (08/30/2008 9:48:25 PM) 

## 2012-03-05 ENCOUNTER — Encounter (HOSPITAL_COMMUNITY)
Admission: RE | Admit: 2012-03-05 | Discharge: 2012-03-05 | Disposition: A | Discharge status: Home / Self Care | Payer: BC Managed Care – PPO | Source: Ambulatory Visit | Attending: Obstetrics and Gynecology | Admitting: Obstetrics and Gynecology

## 2012-03-05 DIAGNOSIS — D649 Anemia, unspecified: Secondary | ICD-10-CM | POA: Insufficient documentation

## 2012-03-05 MED ORDER — SODIUM CHLORIDE 0.9 % IV SOLN
Freq: Once | INTRAVENOUS | Status: AC
  Administered 2012-03-05: 16:00:00 via INTRAVENOUS

## 2012-03-05 MED ORDER — FERUMOXYTOL INJECTION 510 MG/17 ML
510.0000 mg | Freq: Once | INTRAVENOUS | Status: AC
  Administered 2012-03-05: 510 mg via INTRAVENOUS

## 2012-03-05 NOTE — Discharge Instructions (Signed)

## 2012-03-10 ENCOUNTER — Telehealth: Payer: Self-pay | Admitting: Family Medicine

## 2012-03-10 ENCOUNTER — Emergency Department (HOSPITAL_COMMUNITY): Payer: BC Managed Care – PPO

## 2012-03-10 ENCOUNTER — Inpatient Hospital Stay (HOSPITAL_COMMUNITY)
Admission: EM | Admit: 2012-03-10 | Discharge: 2012-03-12 | DRG: 420 | Disposition: A | Discharge status: Home / Self Care | Payer: BC Managed Care – PPO | Attending: Internal Medicine | Admitting: Internal Medicine

## 2012-03-10 ENCOUNTER — Encounter (HOSPITAL_COMMUNITY): Payer: Self-pay | Admitting: *Deleted

## 2012-03-10 DIAGNOSIS — R109 Unspecified abdominal pain: Secondary | ICD-10-CM | POA: Diagnosis present

## 2012-03-10 DIAGNOSIS — D72829 Elevated white blood cell count, unspecified: Secondary | ICD-10-CM | POA: Diagnosis present

## 2012-03-10 DIAGNOSIS — N760 Acute vaginitis: Secondary | ICD-10-CM | POA: Diagnosis present

## 2012-03-10 DIAGNOSIS — R509 Fever, unspecified: Principal | ICD-10-CM | POA: Diagnosis present

## 2012-03-10 DIAGNOSIS — I10 Essential (primary) hypertension: Secondary | ICD-10-CM | POA: Diagnosis present

## 2012-03-10 DIAGNOSIS — R079 Chest pain, unspecified: Secondary | ICD-10-CM | POA: Diagnosis present

## 2012-03-10 DIAGNOSIS — D649 Anemia, unspecified: Secondary | ICD-10-CM | POA: Diagnosis present

## 2012-03-10 DIAGNOSIS — M25569 Pain in unspecified knee: Secondary | ICD-10-CM | POA: Diagnosis present

## 2012-03-10 DIAGNOSIS — N9489 Other specified conditions associated with female genital organs and menstrual cycle: Secondary | ICD-10-CM | POA: Diagnosis present

## 2012-03-10 DIAGNOSIS — D259 Leiomyoma of uterus, unspecified: Secondary | ICD-10-CM | POA: Diagnosis present

## 2012-03-10 DIAGNOSIS — A499 Bacterial infection, unspecified: Secondary | ICD-10-CM | POA: Diagnosis present

## 2012-03-10 DIAGNOSIS — R51 Headache: Secondary | ICD-10-CM | POA: Diagnosis present

## 2012-03-10 DIAGNOSIS — B9689 Other specified bacterial agents as the cause of diseases classified elsewhere: Secondary | ICD-10-CM | POA: Diagnosis present

## 2012-03-10 DIAGNOSIS — IMO0001 Reserved for inherently not codable concepts without codable children: Secondary | ICD-10-CM | POA: Diagnosis present

## 2012-03-10 DIAGNOSIS — M79609 Pain in unspecified limb: Secondary | ICD-10-CM | POA: Diagnosis present

## 2012-03-10 DIAGNOSIS — H0520 Unspecified exophthalmos: Secondary | ICD-10-CM | POA: Diagnosis present

## 2012-03-10 DIAGNOSIS — D509 Iron deficiency anemia, unspecified: Secondary | ICD-10-CM | POA: Diagnosis present

## 2012-03-10 HISTORY — DX: Anemia, unspecified: D64.9

## 2012-03-10 LAB — DIFFERENTIAL
Eosinophils Absolute: 0 10*3/uL (ref 0.0–0.7)
Eosinophils Relative: 0 % (ref 0–5)
Monocytes Absolute: 1.1 10*3/uL — ABNORMAL HIGH (ref 0.1–1.0)
Neutrophils Relative %: 92 % — ABNORMAL HIGH (ref 43–77)

## 2012-03-10 LAB — BASIC METABOLIC PANEL
BUN: 10 mg/dL (ref 6–23)
Creatinine, Ser: 0.85 mg/dL (ref 0.50–1.10)
GFR calc Af Amer: >90 mL/min (ref >90)
GFR calc non Af Amer: 86 mL/min — ABNORMAL LOW (ref >90)

## 2012-03-10 LAB — URINALYSIS, ROUTINE W REFLEX MICROSCOPIC
Bilirubin Urine: NEGATIVE
Leukocytes, UA: NEGATIVE
Nitrite: NEGATIVE
Specific Gravity, Urine: 1.024 (ref 1.005–1.030)
Urobilinogen, UA: 1 mg/dL (ref 0.0–1.0)

## 2012-03-10 LAB — CBC
MCH: 22 pg — ABNORMAL LOW (ref 26.0–34.0)
MCHC: 28.5 g/dL — ABNORMAL LOW (ref 30.0–36.0)
Platelets: 307 10*3/uL (ref 150–400)
RBC: 3.78 MIL/uL — ABNORMAL LOW (ref 3.87–5.11)

## 2012-03-10 MED ORDER — ACETAMINOPHEN 325 MG PO TABS
650.0000 mg | ORAL_TABLET | Freq: Once | ORAL | Status: AC
  Administered 2012-03-10: 650 mg via ORAL
  Filled 2012-03-10: qty 2

## 2012-03-10 MED ORDER — MORPHINE SULFATE 4 MG/ML IJ SOLN
4.0000 mg | Freq: Once | INTRAMUSCULAR | Status: AC
  Administered 2012-03-10: 4 mg via INTRAVENOUS
  Filled 2012-03-10: qty 1

## 2012-03-10 MED ORDER — ONDANSETRON HCL 4 MG/2ML IJ SOLN
4.0000 mg | Freq: Once | INTRAMUSCULAR | Status: AC
  Administered 2012-03-10: 4 mg via INTRAVENOUS
  Filled 2012-03-10: qty 2

## 2012-03-10 MED ORDER — SODIUM CHLORIDE 0.9 % IV SOLN
INTRAVENOUS | Status: DC
  Administered 2012-03-10 (×2): via INTRAVENOUS

## 2012-03-10 MED ORDER — IOHEXOL 300 MG/ML  SOLN
100.0000 mL | Freq: Once | INTRAMUSCULAR | Status: AC | PRN
  Administered 2012-03-10: 100 mL via INTRAVENOUS

## 2012-03-10 NOTE — ED Notes (Signed)
Pt able to drink approx 1.5 cups of PO contrast.  Pt states her pain and nausea have improved.  Denies any needs at this time.  Pt now waiting for CT.

## 2012-03-10 NOTE — ED Notes (Signed)
Pt states she has fibroid tumors. Pt states she is going to have surgery when her hgb goes up.pt states she is having lower quad abdominal pain. Pt denies any n/v

## 2012-03-10 NOTE — H&P (Signed)
PCP:  Hannah Beat, MD, MD  Confirmed Dr. Rosemary Holms in Gyn   Chief Complaint:  Fevers, abdominal pain  HPI: 40yoF with h/o uterine fibroids, anemia, and 2 prior episodes of fevers and abdominal  pain in the past year, now presents with fevers, leukocytosis, abdominal pan.   Pt is very reliable historian and relates that she was in usual state of health until  back in May 2012 when one day she was at work and developed sudden onset of lower  bilateral abdominal pain associated with fevers and chills, muscle/body/joint aches, and  migraine. Review of EPIC PCP notes in 04/2011 corroborates this story and documents fever  of 102. The final diagnosis was unclear but thought possibly due to a viral source. These  symptoms however only last one day and resolved on their own. She did not have any  furhter episodes until 01/2012 on President's day when she had the same episode of fevers,  chills, abdominal pain, muscle and body aches, and migraine -- again this resolved on its  own.   For this episode, PCP's office sent off labs and found to have Hgb 6, for which she was  sent to pelvic ultrasound given her reported h/o heavy menstrual periods. She was found  to have fibroids and sent to Dr. Rosemary Holms who plans hysterectomy once her Hgb is >12, for  which she has now received iron infusions x2 in March.   However, earlier today she developed the same bilateral lower quadrant abdominal pain  that started dull and become so severe she came to ED, described as sharp in nature. No  associated nausea, vomiting, or diarrhea. She also endorses bilateral hand and knee pain  that is minimal and chronic, but is much worse during these episodes. She feels hot and  chilled.   In the ED, pt was febrile to 101.8, tachy to 126, rest of vitals stable. Labs significant  for WBC 22.7 with 92% neutros, Hgb 8.3 with MCV 77. UA was negative. CXR negative. Pelvic  exam was done in the ED, and was negative for  cervical motion tenderness, and wet prep  showed many WBC's and moderate clue cells. CT abd/pelvis was done which showed marked  enlargement of uterus and multiple leiomyomas, prominent ovarian veins and pelvic  sidewall veins, question pelvic congestion syndrome, otherwise negative.   ED staff spoke with outpt Gyn Dr. Rosemary Holms who did not feel pelvic congestion was the cause  of fevers, leukocytosis as this is inconsistent with the diagnosis. Dr. Johna Sheriff in  surgery was also called and consulted, but did not feelt his was due to an acute  abdominal process like diverticulitis or appendicitis.   She also endorses minimal chest pain, blurry vision with prior episodes but not now. Her  skin has become more sensitive. She denies dysuria, vaginal discharge, change in pain  with PO intake. No skin changes or rash, oral or vaginal ulcerations. No swelling,  redness, or warmth of her joints. She denies recent travel to foreign countries, mosquito  exposure, prior IV drug use  Past Medical History  Diagnosis Date  . Other specified iron deficiency anemias   . Migraine   . Unspecified essential hypertension   . Anemia     Past Surgical History  Procedure Date  . Cesarean section [id number]  . Abdominoplasty 2005  . Breast lift 2005  . Tubal ligation 2005    Medications:  HOME MEDS:  Only iron pills regularly  Prior to Admission medications  Medication Sig Start Date End Date Taking? Authorizing Provider  dextromethorphan-guaiFENesin (MUCINEX DM) 30-600 MG per 12 hr tablet Take 1 tablet by mouth every 12 (twelve) hours.   Yes Historical Provider, MD  Ferrous Gluconate 325 (36 FE) MG TABS Take 1 tablet by mouth 2 (two) times daily. 02/13/12  Yes Hannah Beat, MD  SUMAtriptan (IMITREX) 50 MG tablet Take 1 tablet (50 mg total) by mouth every 2 (two) hours as needed. (May repeat once) 05/22/11  Yes Hannah Beat, MD    Allergies:  Allergies  Allergen Reactions  . Sulfa Antibiotics  Itching    shaky    Social History:   reports that she has never smoked. She has never used smokeless tobacco. She reports that she does not drink alcohol or use illicit drugs.  Children: 9,7,5,4x2 (5 total). 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 No regular exercise Never smoker, no drugs or alcohol, denies h/o IVDU. Works in an office.   Family History: Family History  Problem Relation Age of Onset  . Arthritis      family history  . Prostate cancer Father   . Diabetes      1st degree relative  . Hypertension      family history    Physical Exam: Filed Vitals:   03/10/12 1453 03/10/12 1541 03/10/12 1705 03/10/12 2029  BP:  123/69 125/70 125/71  Pulse: 108 104 96 93  Temp: 100 F (37.8 C) 98.1 F (36.7 C) 98.8 F (37.1 C)   TempSrc: Oral Oral Oral   Resp:  18  18  SpO2:  99% 100% 99%   Blood pressure 125/71, pulse 93, temperature 98.8 F (37.1 C), temperature source Oral, resp. rate 18, last menstrual period 03/09/2012, SpO2 99.00%.  Gen: Young, overall healthy appearing F in no distress, pleasant, able to relate history,  appears fatigued but not floridly toxic appearing HEENT: Pupils are round, reactive, EOMI, sclera clear without icterus, but she seems to  have prominent exophthalmos and bulging eyes, but no noted lid lag. Mouth is moist,  overall normal except tongue has diffuse small black macules on it. Some minimal poor  dentition  Neck: Diffusely full, although thyroid not appreciably enlarged to palpation Lungs: CTAB no w/c/r, good air movement, normal exam overall Heart: Regular, not tachycardic, no murmurs noted Abd: Minimally distended, with guarding, and hard abdomen, not soft, but not peritoneal.  No TTP or facial grimacing in BUQ's, but does endorse TTP in BLQ's, no rebound  tenderness. BS hyperactive Extrem: Warm, perfusing well, no coolness or cyanosis, bilateral radials palpable.  Bilateral hands and knees are without swelling, erythema, warmth, overall  normal  appearing Neuro: Alert, attentive, CN 2-12 intact, moves extremities on her own, grossly non-focal  Exam   Labs & Imaging Results for orders placed during the hospital encounter of 03/10/12 (from the past 48 hour(s))  URINALYSIS, ROUTINE W REFLEX MICROSCOPIC     Status: Normal   Collection Time   03/10/12  2:54 PM      Component Value Range Comment   Color, Urine YELLOW  YELLOW     APPearance CLEAR  CLEAR     Specific Gravity, Urine 1.024  1.005 - 1.030     pH 6.0  5.0 - 8.0     Glucose, UA NEGATIVE  NEGATIVE (mg/dL)    Hgb urine dipstick NEGATIVE  NEGATIVE     Bilirubin Urine NEGATIVE  NEGATIVE     Ketones, ur NEGATIVE  NEGATIVE (mg/dL)    Protein, ur NEGATIVE  NEGATIVE (mg/dL)    Urobilinogen, UA 1.0  0.0 - 1.0 (mg/dL)    Nitrite NEGATIVE  NEGATIVE     Leukocytes, UA NEGATIVE  NEGATIVE  MICROSCOPIC NOT DONE ON URINES WITH NEGATIVE PROTEIN, BLOOD, LEUKOCYTES, NITRITE, OR GLUCOSE <1000 mg/dL.  POCT PREGNANCY, URINE     Status: Normal   Collection Time   03/10/12  2:58 PM      Component Value Range Comment   Preg Test, Ur NEGATIVE  NEGATIVE    CBC     Status: Abnormal   Collection Time   03/10/12  3:24 PM      Component Value Range Comment   WBC 22.7 (*) 4.0 - 10.5 (K/uL)    RBC 3.78 (*) 3.87 - 5.11 (MIL/uL)    Hemoglobin 8.3 (*) 12.0 - 15.0 (g/dL)    HCT 91.4 (*) 78.2 - 46.0 (%)    MCV 77.0 (*) 78.0 - 100.0 (fL)    MCH 22.0 (*) 26.0 - 34.0 (pg)    MCHC 28.5 (*) 30.0 - 36.0 (g/dL)    RDW 95.6 (*) 21.3 - 15.5 (%)    Platelets 307  150 - 400 (K/uL)   DIFFERENTIAL     Status: Abnormal   Collection Time   03/10/12  3:24 PM      Component Value Range Comment   Neutrophils Relative 92 (*) 43 - 77 (%)    Lymphocytes Relative 3 (*) 12 - 46 (%)    Monocytes Relative 5  3 - 12 (%)    Eosinophils Relative 0  0 - 5 (%)    Basophils Relative 0  0 - 1 (%)    Neutro Abs 20.9 (*) 1.7 - 7.7 (K/uL)    Lymphs Abs 0.7  0.7 - 4.0 (K/uL)    Monocytes Absolute 1.1 (*) 0.1 - 1.0 (K/uL)     Eosinophils Absolute 0.0  0.0 - 0.7 (K/uL)    Basophils Absolute 0.0  0.0 - 0.1 (K/uL)    RBC Morphology POLYCHROMASIA PRESENT   OVALOCYTES   Smear Review LARGE PLATELETS PRESENT   GIANT PLATELETS SEEN  BASIC METABOLIC PANEL     Status: Abnormal   Collection Time   03/10/12  3:45 PM      Component Value Range Comment   Sodium 134 (*) 135 - 145 (mEq/L)    Potassium 3.4 (*) 3.5 - 5.1 (mEq/L)    Chloride 99  96 - 112 (mEq/L)    CO2 24  19 - 32 (mEq/L)    Glucose, Bld 99  70 - 99 (mg/dL)    BUN 10  6 - 23 (mg/dL)    Creatinine, Ser 0.86  0.50 - 1.10 (mg/dL)    Calcium 9.3  8.4 - 10.5 (mg/dL)    GFR calc non Af Amer 86 (*) >90 (mL/min)    GFR calc Af Amer >90  >90 (mL/min)   WET PREP, GENITAL     Status: Abnormal   Collection Time   03/10/12  7:03 PM      Component Value Range Comment   Yeast Wet Prep HPF POC NONE SEEN  NONE SEEN     Trich, Wet Prep NONE SEEN  NONE SEEN     Clue Cells Wet Prep HPF POC MODERATE (*) NONE SEEN     WBC, Wet Prep HPF POC MANY (*) NONE SEEN     Ct Abdomen Pelvis W Contrast  03/10/2012  *RADIOLOGY REPORT*  Clinical Data: Lower abdominal pain.  History of fibroids  CT  ABDOMEN AND PELVIS WITH CONTRAST  Technique:  Multidetector CT imaging of the abdomen and pelvis was performed following the standard protocol during bolus administration of intravenous contrast.  Contrast: OMNIPAQUE IOHEXOL 300 MG/ML IJ SOLN  Comparison: Multiple previous ultrasounds most recently 02/19/2012.  Findings: Lung bases are clear.  No pleural or pericardial fluid. The liver has a normal appearance without focal lesions or biliary ductal dilatation.  No calcified gallstones.  The spleen is normal. The pancreas is normal.  The adrenal glands are normal.  The kidneys are normal.  The aorta and IVC are normal.  No retroperitoneal mass or adenopathy.  There are some normal sized lymph nodes.  The uterus is enlarged and contains multiple leiomyomas.  Uterine cervical complex measures and  total 16 cm in length, 9 cm right to left and 9 cm front to back.  Several leiomyomas show calcification.  Both ovaries appear normal.  Ovarian veins bilaterally are enlarged.  Pelvic sidewall veins are enlarged. This could possibly relate to pelvic congestion syndrome which can cause lower abdominal and pelvic pain.  No free fluid.  The bladder is normal.  IMPRESSION: Marked enlargement of the uterus with multiple leiomyomas. Prominence of the ovarian veins and of the pelvic sidewall veins raising the question of pelvic congestion syndrome, which can cause lower abdominal and pelvic pain.  No other finding of note.  Original Report Authenticated By: Thomasenia Sales, M.D.   Dg Chest Port 1 View  03/10/2012  *RADIOLOGY REPORT*  Clinical Data: 41 year old female with cough, fever, abdominal pain.  PORTABLE CHEST - 1 VIEW  Comparison: None.  Findings: Portable semi upright AP view 1538 hours.  Lung volumes are within normal limits.  Cardiac size and mediastinal contours are within normal limits.  Allowing for portable technique, the lungs are clear.  No pneumothorax or pneumoperitoneum.  IMPRESSION: No acute cardiopulmonary abnormality.  Original Report Authenticated By: Harley Hallmark, M.D.    Impression Present on Admission:  .Fever of undetermined origin .Leukocytosis .Pelvic congestion syndrome .Uterine fibroid   40yoF with h/o uterine fibroids, anemia, and 2 prior episodes of fevers and abdominal  pain in the past year, now presents with fevers, leukocytosis, abdominal pan.   1. Fevers, leukocytosis: Given 3 episodes in the past year, it's reasonable to start a  FUO workup at this point. I am not really sure the source though.   The most localizing source at this point would be a pelvic process, however although  pelvic congestion syndrome is not well characterized, it's not typically associated with  fevers/leukocytosis, and neither are uterine fibroids. Therefore overall I think these  are  incidental findings that explain the pelvic pain and anemia, but not necessarily the  fevers.  Other abdominal process is considered, but nothing showing up on imaging. Also endorses  bilateral hand and knee pain, although exam unrevealing -- consider rheumatologic  sources. Crytogenic osteomyelitis considered.   Polychromasia seen on in her RBC's is a marker of young RBC's being released, and can be  seen in myelofibrosis (which can also have high WBC's), however could also be due to her  iron infusions and a normal response to her anemia -- so will hold on working this up for  now.   - LFT's, ESR, CRP, ANA, RF, ANCA, cryoglobulins, LDH, blood cultures x2, PPD, heterophile  Ab's, HIV test, ferritin, flu swab, urine culture, CK - Bilateral hand and knee x-rays - If the above not diagnostic, consider: CT chest with contrast, tagged  WBC scan,  echocardiogram, Heme and ID consultation  2. Exophthalmos: Her eyes do seem quite prominent and her neck is full. Will get TSH  although this would be cautiously interpreted given her acute illness.   3. Anemia and fibroids: Her Hgb was responded well to iron infusion up to >8, so this is  stable. As above, doubt this is related.   Ambulation for DVT prophylaxis Regular bed, [facility] team 2 Presumed full code   Other plans as per orders.  Zilla Shartzer 03/10/2012, 10:24 PM

## 2012-03-10 NOTE — Consult Note (Signed)
Reason for Consult:abdominal pain Referring Physician: Lavonne Chick  Pamela [name] is an 41 y.o. female.  HPI: patient is a 41 year old African American female who presents to the emergency room due to acute lower abdominal pain. She states that she awoke this morning feeling okay. About 12 hours ago she noticed a sharp acute pain in her lower abdomen more on the left than the right. Within about half an hour this pain became very severe. She describes sharp shooting constant pain across her lower abdomen more on the left than the right. It was made worse with any motion. She felt chilled and cold. The pain became severe enough that she presented to the emergency room. She is not having any nausea or vomiting. She is afebrile in the emergency room. She has not had anorexia. She has no dysuria or frequency. She had a normal bowel movement this morning. No diarrhea.  The patient reports she has had a very similar onset of pain now on 3 previous occasions. It was somewhat worse today but very similar to previous occurrences over the last several months. Her primary physician ordered a pelvic ultrasound after one of these episodes and she was found to have a very large fibroid uterus. She saw Dr. Rosemary Holms and reportedlyeportedly a hysterectomy is planned.  The patient states that currently she is feeling significantly better than she did this morning. She states that previous episodes of pain have resolved within about 24-hours. She is also complaining of a headache as well as aching pains in her hands and knees which also occurred with her previous episodes of abdominal pain.  Past Medical History  Diagnosis Date  . Other specified iron deficiency anemias   . Migraine   . Unspecified essential hypertension   . Anemia     Past Surgical History  Procedure Date  . Cesarean section [id number]  . Abdominoplasty 2005  . Breast lift 2005  . Tubal ligation 2005    Family History  Problem Relation Age  of Onset  . Arthritis      family history  . Prostate cancer Father   . Diabetes      1st degree relative  . Hypertension      family history    Social History:  reports that she has never smoked. She does not have any smokeless tobacco history on file. She reports that she does not drink alcohol or use illicit drugs.  Allergies:  Allergies  Allergen Reactions  . Sulfa Antibiotics Itching    shaky    Medications: Prior to Admission:  (Not in a hospital admission)  Results for orders placed during the hospital encounter of 03/10/12 (from the past 48 hour(s))  URINALYSIS, ROUTINE W REFLEX MICROSCOPIC     Status: Normal   Collection Time   03/10/12  2:54 PM      Component Value Range Comment   Color, Urine YELLOW  YELLOW     APPearance CLEAR  CLEAR     Specific Gravity, Urine 1.024  1.005 - 1.030     pH 6.0  5.0 - 8.0     Glucose, UA NEGATIVE  NEGATIVE (mg/dL)    Hgb urine dipstick NEGATIVE  NEGATIVE     Bilirubin Urine NEGATIVE  NEGATIVE     Ketones, ur NEGATIVE  NEGATIVE (mg/dL)    Protein, ur NEGATIVE  NEGATIVE (mg/dL)    Urobilinogen, UA 1.0  0.0 - 1.0 (mg/dL)    Nitrite NEGATIVE  NEGATIVE     Leukocytes,  UA NEGATIVE  NEGATIVE  MICROSCOPIC NOT DONE ON URINES WITH NEGATIVE PROTEIN, BLOOD, LEUKOCYTES, NITRITE, OR GLUCOSE <1000 mg/dL.  POCT PREGNANCY, URINE     Status: Normal   Collection Time   03/10/12  2:58 PM      Component Value Range Comment   Preg Test, Ur NEGATIVE  NEGATIVE    CBC     Status: Abnormal   Collection Time   03/10/12  3:24 PM      Component Value Range Comment   WBC 22.7 (*) 4.0 - 10.5 (K/uL)    RBC 3.78 (*) 3.87 - 5.11 (MIL/uL)    Hemoglobin 8.3 (*) 12.0 - 15.0 (g/dL)    HCT 40.9 (*) 81.1 - 46.0 (%)    MCV 77.0 (*) 78.0 - 100.0 (fL)    MCH 22.0 (*) 26.0 - 34.0 (pg)    MCHC 28.5 (*) 30.0 - 36.0 (g/dL)    RDW 91.4 (*) 78.2 - 15.5 (%)    Platelets 307  150 - 400 (K/uL)   DIFFERENTIAL     Status: Abnormal   Collection Time   03/10/12  3:24 PM       Component Value Range Comment   Neutrophils Relative 92 (*) 43 - 77 (%)    Lymphocytes Relative 3 (*) 12 - 46 (%)    Monocytes Relative 5  3 - 12 (%)    Eosinophils Relative 0  0 - 5 (%)    Basophils Relative 0  0 - 1 (%)    Neutro Abs 20.9 (*) 1.7 - 7.7 (K/uL)    Lymphs Abs 0.7  0.7 - 4.0 (K/uL)    Monocytes Absolute 1.1 (*) 0.1 - 1.0 (K/uL)    Eosinophils Absolute 0.0  0.0 - 0.7 (K/uL)    Basophils Absolute 0.0  0.0 - 0.1 (K/uL)    RBC Morphology POLYCHROMASIA PRESENT   OVALOCYTES   Smear Review LARGE PLATELETS PRESENT   GIANT PLATELETS SEEN  BASIC METABOLIC PANEL     Status: Abnormal   Collection Time   03/10/12  3:45 PM      Component Value Range Comment   Sodium 134 (*) 135 - 145 (mEq/L)    Potassium 3.4 (*) 3.5 - 5.1 (mEq/L)    Chloride 99  96 - 112 (mEq/L)    CO2 24  19 - 32 (mEq/L)    Glucose, Bld 99  70 - 99 (mg/dL)    BUN 10  6 - 23 (mg/dL)    Creatinine, Ser 9.56  0.50 - 1.10 (mg/dL)    Calcium 9.3  8.4 - 10.5 (mg/dL)    GFR calc non Af Amer 86 (*) >90 (mL/min)    GFR calc Af Amer >90  >90 (mL/min)     Ct Abdomen Pelvis W Contrast  03/10/2012  *RADIOLOGY REPORT*  Clinical Data: Lower abdominal pain.  History of fibroids  CT ABDOMEN AND PELVIS WITH CONTRAST  Technique:  Multidetector CT imaging of the abdomen and pelvis was performed following the standard protocol during bolus administration of intravenous contrast.  Contrast: OMNIPAQUE IOHEXOL 300 MG/ML IJ SOLN  Comparison: Multiple previous ultrasounds most recently 02/19/2012.  Findings: Lung bases are clear.  No pleural or pericardial fluid. The liver has a normal appearance without focal lesions or biliary ductal dilatation.  No calcified gallstones.  The spleen is normal. The pancreas is normal.  The adrenal glands are normal.  The kidneys are normal.  The aorta and IVC are normal.  No retroperitoneal mass or  adenopathy.  There are some normal sized lymph nodes.  The uterus is enlarged and contains multiple  leiomyomas.  Uterine cervical complex measures and total 16 cm in length, 9 cm right to left and 9 cm front to back.  Several leiomyomas show calcification.  Both ovaries appear normal.  Ovarian veins bilaterally are enlarged.  Pelvic sidewall veins are enlarged. This could possibly relate to pelvic congestion syndrome which can cause lower abdominal and pelvic pain.  No free fluid.  The bladder is normal.  IMPRESSION: Marked enlargement of the uterus with multiple leiomyomas. Prominence of the ovarian veins and of the pelvic sidewall veins raising the question of pelvic congestion syndrome, which can cause lower abdominal and pelvic pain.  No other finding of note.  Original Report Authenticated By: Thomasenia Sales, M.D.   Dg Chest Port 1 View  03/10/2012  *RADIOLOGY REPORT*  Clinical Data: 41 year old female with cough, fever, abdominal pain.  PORTABLE CHEST - 1 VIEW  Comparison: None.  Findings: Portable semi upright AP view 1538 hours.  Lung volumes are within normal limits.  Cardiac size and mediastinal contours are within normal limits.  Allowing for portable technique, the lungs are clear.  No pneumothorax or pneumoperitoneum.  IMPRESSION: No acute cardiopulmonary abnormality.  Original Report Authenticated By: Harley Hallmark, M.D.    Review of Systems  Constitutional: Positive for chills.  Respiratory: Negative for cough and shortness of breath.   Gastrointestinal: Positive for abdominal pain. Negative for nausea, vomiting, diarrhea, constipation and blood in stool.  Genitourinary: Negative for dysuria and frequency.  Musculoskeletal: Positive for myalgias and joint pain.  Neurological:       Headache   Blood pressure 125/70, pulse 96, temperature 98.8 F (37.1 C), temperature source Oral, resp. rate 18, last menstrual period 03/09/2012, SpO2 100.00%. Physical Exam General: Well-developed African American female who appears uncomfortable but in no severe distress Skin: Warm and dry without  rash or infection HEENT: No palpable masses or thyromegaly. Sclera nonicteric. Lymph nodes: No palpable cervical, supraclavicular, or inguinal nodes Lungs: Clear without wheezing or increased work of breathing Cardiovascular: Mild tachycardia. No edema. Abdomen: Well-healed abdominoplasty incisions. Bowel sounds are active. There is moderate tenderness across the lower abdomen but mostly in the midline over a palpable uterus at the fundus about halfway between the pubis and umbilicus. There is some guarding over the uterus. No other masses or organomegaly appreciated.  Assessment/Plan: Acute abdominal pain associated with leukocytosis. This has been a recurring symptom complex for her in recent months. The only finding radiographically is a markedly enlarged fibroid uterus. I'm not sure as to the source of her pain. However I think this is extremely unlikely to be an acute surgical process such as appendicitis or diverticulitis. The time course and symptom complex is not typical for appendicitis. She has no associated GI symptoms. I personally reviewed her CT scan and although the appendix is nonvisualized there is no inflammatory change around the cecum or bowel. With a white blood count of 22,000 this were appendicitis I would expect to see inflammatory change. She also is feeling better as she has after previous episodes. Pelvic congestion syndrome has been raised by radiology but Dr. Rosemary Holms feels that the leukocytosis would not be typical. I wonder she could possibly have had a bleed related to a leiomyoma. I however think this is highly unlikely to be a surgical abdomen for reasons above. As long as she continues to improve in the emergency room I think discharge with  instructions to return tomorrow if her symptoms are not resolving is reasonable and I recommended close followup with Dr. Rosemary Holms.Marland Kitchen  Caryl Manas T 03/10/2012, 7:58 PM

## 2012-03-10 NOTE — ED Notes (Signed)
Dr. Kaylyn Layer (Hospitalist) to bedside with patient for evaluation.

## 2012-03-10 NOTE — Telephone Encounter (Signed)
Pt's mother called and wanted to let you know that she was taking Pamela [name] to the hospital. She wasn't feeling well, having cold chills and her mother wanted to take her immediately to the hospital for help. Pamela [name] has a tumor and the mother was saying that it may have to be removed???

## 2012-03-10 NOTE — ED Notes (Signed)
Patient c/o lower ABD pain 6/10 radiating to back. Family remains at bedside with patient. Plan to admit. Awaiting hospital bed. Patient and family member aware of POC and possible delays.

## 2012-03-10 NOTE — ED Notes (Signed)
Patient transported to CT 

## 2012-03-10 NOTE — Telephone Encounter (Signed)
It is always appropriate if they have a very high level of concern in a sick patient to have them eval acutely in the ER if very concerned. I am unclear about any tumor other than uterine fibroids.

## 2012-03-10 NOTE — ED Provider Notes (Signed)
History     CSN: [id number]  Arrival date & time 03/10/12  1133   First MD Initiated Contact with Patient 03/10/12 1508      Chief Complaint  Patient presents with  . Abdominal Pain    (Consider location/radiation/quality/duration/timing/severity/associated sxs/prior treatment) Patient is a 41 y.o. female presenting with abdominal pain. The history is provided by the patient. The history is limited by the condition of the patient.  Abdominal Pain The primary symptoms of the illness include abdominal pain and fever. The primary symptoms of the illness do not include shortness of breath, nausea, vomiting, diarrhea, dysuria, vaginal discharge or vaginal bleeding.  Symptoms associated with the illness do not include chills or hematuria.   the patient is a 41 year old, female, who presents to emergency department complaining of a nonproductive cough, fever, and lower abdominal pain.  She denies chest pain, or shortness of breath.  She develop denies nausea, vomiting, diarrhea, urinary tract symptoms, vaginal discharge or vaginal bleeding.  Her symptoms began today.  Her abdominal pain.  Does not radiate.  Level V caveat applies for severe illness and urgent need for intervention  Past Medical History  Diagnosis Date  . Other specified iron deficiency anemias   . Migraine   . Unspecified essential hypertension   . Anemia     Past Surgical History  Procedure Date  . Cesarean section [id number]  . Abdominoplasty 2005  . Breast lift 2005  . Tubal ligation 2005    Family History  Problem Relation Age of Onset  . Arthritis      family history  . Prostate cancer Father   . Diabetes      1st degree relative  . Hypertension      family history    History  Substance Use Topics  . Smoking status: Never Smoker   . Smokeless tobacco: Not on file  . Alcohol Use: No    OB History    Grav Para Term Preterm Abortions TAB SAB Ect Mult Living                  Review of Systems    Constitutional: Positive for fever. Negative for chills.  HENT: Negative for sore throat.   Respiratory: Positive for cough. Negative for shortness of breath.   Cardiovascular: Negative for chest pain.  Gastrointestinal: Positive for abdominal pain. Negative for nausea, vomiting and diarrhea.  Genitourinary: Negative for dysuria, hematuria, vaginal bleeding and vaginal discharge.  Skin: Negative for rash.  Neurological: Negative for headaches.  Psychiatric/Behavioral: Negative for confusion.  All other systems reviewed and are negative.    Allergies  Sulfa antibiotics  Home Medications   Current Outpatient Rx  Name Route Sig Dispense Refill  . DM-GUAIFENESIN ER 30-600 MG PO TB12 Oral Take 1 tablet by mouth every 12 (twelve) hours.    . FERROUS GLUCONATE 325 (36 FE) MG PO TABS Oral Take 1 tablet by mouth 2 (two) times daily. 60 tablet 5  . SUMATRIPTAN SUCCINATE 50 MG PO TABS Oral Take 1 tablet (50 mg total) by mouth every 2 (two) hours as needed. (May repeat once) 10 tablet 5    BP 146/82  Pulse 108  Temp(Src) 100 F (37.8 C) (Oral)  Resp 18  SpO2 100%  LMP 03/09/2012  Physical Exam  Vitals reviewed. Constitutional: She is oriented to person, place, and time. She appears well-developed and well-nourished. No distress.       Uncomfortable appearing holding lower abdomen  HENT:  Head: Normocephalic and  atraumatic.  Mouth/Throat: Oropharynx is clear and moist. No oropharyngeal exudate.  Eyes: Conjunctivae and EOM are normal.  Neck: Normal range of motion. Neck supple.  Cardiovascular:  No murmur heard.      Tachycardia  Pulmonary/Chest: Effort normal. No respiratory distress. She has no wheezes. She has no rales.  Abdominal: Soft. She exhibits no distension. There is tenderness. There is no rebound and no guarding.       Tenderness and bilateral lower quadrants, and in the suprapubic No tenderness in the upper abdomen  Genitourinary:       Pelvic done with CNA  chaperone.  Scant yellow discharge.  No cmt + adnexal ttp.  + uterine enlargement.  Musculoskeletal: Normal range of motion.  Neurological: She is alert and oriented to person, place, and time.  Skin: Skin is warm and dry. No rash noted.  Psychiatric: She has a normal mood and affect. Thought content normal.    ED Course  Procedures (including critical care time) 41 year old, female, with abdominal pain, fever, and nonproductive cough.  She has tenderness across her lower abdomen.  Her lungs are clear to auscultation.  We will perform a chest x-ray, laboratory testing, and a CAT scan to try to determine the etiology for her abdominal pain, and fever.  We'll establish an IV and treat her with Tylenol for her fever and analgesics for the pain   Labs Reviewed  POCT PREGNANCY, URINE  URINALYSIS, ROUTINE W REFLEX MICROSCOPIC  CBC  DIFFERENTIAL  BASIC METABOLIC PANEL  PREGNANCY, URINE   No results found.   No diagnosis found.  7:12 PM Spoke with Dr. Johna Sheriff.  He will come see the pt in the ed.  8:08 PM Pt was seen by dr. Johna Sheriff.  I spoke with him.  He does not think this is an appendicitis or diverticulitis.  She is still in pain with significant ttp. Will admit for obs and further tx.  CRITICAL CARE Performed by: Nicholes Stairs   Total critical care time: 30 min  Critical care time was exclusive of separately billable procedures and treating other patients.  Critical care was necessary to treat or prevent imminent or life-threatening deterioration.  Critical care was time spent personally by me on the following activities: development of treatment plan with patient and/or surrogate as well as nursing, discussions with consultants, evaluation of patient's response to treatment, examination of patient, obtaining history from patient or surrogate, ordering and performing treatments and interventions, ordering and review of laboratory studies, ordering and review of  radiographic studies, pulse oximetry and re-evaluation of patient's condition.  8:38 PM Spoke with triad.   He will admit  MDM  Abdominal pain, with fever and leukocytosis        Cheri Guppy, MD 03/10/12 2038

## 2012-03-10 NOTE — ED Notes (Signed)
Surgeon at bedside with patient. 

## 2012-03-10 NOTE — ED Notes (Signed)
Radiology at bedside for portable hands/knee xrays.

## 2012-03-11 ENCOUNTER — Encounter (HOSPITAL_COMMUNITY): Payer: Self-pay | Admitting: *Deleted

## 2012-03-11 DIAGNOSIS — D509 Iron deficiency anemia, unspecified: Secondary | ICD-10-CM | POA: Diagnosis present

## 2012-03-11 DIAGNOSIS — R509 Fever, unspecified: Secondary | ICD-10-CM

## 2012-03-11 DIAGNOSIS — B9689 Other specified bacterial agents as the cause of diseases classified elsewhere: Secondary | ICD-10-CM | POA: Diagnosis present

## 2012-03-11 DIAGNOSIS — R109 Unspecified abdominal pain: Secondary | ICD-10-CM | POA: Diagnosis present

## 2012-03-11 LAB — DIFFERENTIAL
Basophils Absolute: 0 10*3/uL (ref 0.0–0.1)
Eosinophils Absolute: 0.1 10*3/uL (ref 0.0–0.7)
Lymphocytes Relative: 9 % — ABNORMAL LOW (ref 12–46)
Monocytes Relative: 6 % (ref 3–12)

## 2012-03-11 LAB — CBC
Platelets: 223 10*3/uL (ref 150–400)
RDW: 26.6 % — ABNORMAL HIGH (ref 11.5–15.5)
WBC: 13.7 10*3/uL — ABNORMAL HIGH (ref 4.0–10.5)

## 2012-03-11 LAB — INFLUENZA PANEL BY PCR (TYPE A & B)
H1N1 flu by pcr: NOT DETECTED
Influenza A By PCR: NEGATIVE
Influenza B By PCR: NEGATIVE

## 2012-03-11 LAB — HEPATIC FUNCTION PANEL
ALT: 18 U/L (ref 0–35)
AST: 22 U/L (ref 0–37)
Albumin: 3.2 g/dL — ABNORMAL LOW (ref 3.5–5.2)
Total Protein: 7 g/dL (ref 6.0–8.3)

## 2012-03-11 LAB — PROTIME-INR
INR: 1.26 (ref 0.00–1.49)
Prothrombin Time: 16.1 seconds — ABNORMAL HIGH (ref 11.6–15.2)

## 2012-03-11 LAB — CK: Total CK: 86 U/L (ref 7–177)

## 2012-03-11 LAB — RHEUMATOID FACTOR: Rhuematoid fact SerPl-aCnc: <10 IU/mL (ref <=14)

## 2012-03-11 LAB — TSH: TSH: 1.328 u[IU]/mL (ref 0.350–4.500)

## 2012-03-11 LAB — C-REACTIVE PROTEIN: CRP: 7 mg/dL — ABNORMAL HIGH (ref <0.60)

## 2012-03-11 LAB — PHOSPHORUS: Phosphorus: 3 mg/dL (ref 2.3–4.6)

## 2012-03-11 LAB — HIV ANTIBODY (ROUTINE TESTING W REFLEX): HIV: NONREACTIVE

## 2012-03-11 MED ORDER — ONDANSETRON HCL 4 MG/2ML IJ SOLN: 4.0000 mg | Freq: Four times a day (QID) | INTRAMUSCULAR | Status: DC | PRN

## 2012-03-11 MED ORDER — DOCUSATE SODIUM 100 MG PO CAPS
100.0000 mg | ORAL_CAPSULE | Freq: Two times a day (BID) | ORAL | Status: DC
  Administered 2012-03-11 – 2012-03-12 (×3): 100 mg via ORAL
  Filled 2012-03-11 (×6): qty 1

## 2012-03-11 MED ORDER — METRONIDAZOLE 500 MG PO TABS
500.0000 mg | ORAL_TABLET | Freq: Two times a day (BID) | ORAL | Status: DC
  Administered 2012-03-11 – 2012-03-12 (×3): 500 mg via ORAL
  Filled 2012-03-11 (×6): qty 1

## 2012-03-11 MED ORDER — ONDANSETRON HCL 4 MG PO TABS: 4.0000 mg | ORAL_TABLET | Freq: Four times a day (QID) | ORAL | Status: DC | PRN

## 2012-03-11 MED ORDER — SODIUM CHLORIDE 0.9 % IV SOLN
Freq: Once | INTRAVENOUS | Status: AC
  Administered 2012-03-12: 09:00:00 via INTRAVENOUS

## 2012-03-11 MED ORDER — ACETAMINOPHEN 325 MG PO TABS
650.0000 mg | ORAL_TABLET | Freq: Four times a day (QID) | ORAL | Status: DC | PRN
  Administered 2012-03-11: 650 mg via ORAL
  Filled 2012-03-11: qty 2

## 2012-03-11 MED ORDER — SENNA 8.6 MG PO TABS
1.0000 | ORAL_TABLET | Freq: Two times a day (BID) | ORAL | Status: DC
  Administered 2012-03-11 – 2012-03-12 (×3): 8.6 mg via ORAL
  Filled 2012-03-11 (×3): qty 1

## 2012-03-11 MED ORDER — ACETAMINOPHEN 650 MG RE SUPP: 650.0000 mg | Freq: Four times a day (QID) | RECTAL | Status: DC | PRN

## 2012-03-11 MED ORDER — TUBERCULIN PPD 5 UNIT/0.1ML ID SOLN
5.0000 [IU] | Freq: Once | INTRADERMAL | Status: AC
  Administered 2012-03-11: 5 [IU] via INTRADERMAL
  Filled 2012-03-11: qty 0.1

## 2012-03-11 MED ORDER — MORPHINE SULFATE 2 MG/ML IJ SOLN
2.0000 mg | INTRAMUSCULAR | Status: DC | PRN
  Administered 2012-03-11 – 2012-03-12 (×3): 4 mg via INTRAVENOUS
  Filled 2012-03-11 (×3): qty 2

## 2012-03-11 NOTE — ED Notes (Signed)
Ice chips given

## 2012-03-11 NOTE — Consult Note (Signed)
Infectious Diseases Initial Consultation         Date of Admission:  03/10/2012  Date of Consult:  03/11/2012  Reason for Consult: Recurrent fever and abdominal pain Referring Physician: Dr. Molli Posey   Problem List:  Principal Problem:  *Fever of undetermined origin Active Problems:  Recurrent abdominal pain  Leukocytosis  Pelvic congestion syndrome  Uterine fibroid  Iron deficiency anemia  Bacterial vaginosis   Recommendations: 1. Agree with metronidazole 500 mg twice a day for 7 days for bacterial vaginosis   Assessment: I do not have a good explanation for her episodic fever and pain. Each of resolve spontaneously in only a few hours and there has been no clear evidence of any active infection. With the last 2 episodes been associated with severe lower quadrant pain I do wonder if it is somehow related to her uterine enlargement but the CAT scan did not show any evidence of bleeding or abscess. She does have bacterial vaginosis but this is unlikely to cause her any systemic illness. As she is much better without any specific intervention I would not start any other antibiotic therapy at this time. I do not think she warrants any further workup at this point.    HPI: Pamela [name] is a 41 y.o. female with chronic uterine leiomyomas and iron deficiency anemia related to menstrual blood loss. She was admitted yesterday with severe left lower quadrant pain and fever. She recalls having 2 similar episodes. The first occurred in May of last year when she was at work and became extremely cold with shaking chills. She developed a diffuse body aches and went home. Her mother cover her in warm blankets and she slowly got better over a 3-4 hours. She saw her physician the following day but at that point was back to normal and had no further fever or pain. That illness was presumed to have been a transient viral illness. She did well until last month when she again had very cold and had  documented fever. She aches all over but also had severe left lower quadrant pain. Her parents had to come pick her up at work and take her home. That illness again resolved over 3-4 hours spontaneously. She then had a similar episode starting yesterday morning. The pain was even more severe and incapacitating. She came to the emergency department where she was noted to have fever and leukocytosis. Her CAT scan showed uterine enlargement with leiomyomas but no acute change. As before, her fever and pain resolved spontaneously within a few hours.  She had been considering hysterectomy but Dr. Rosemary Holms, her gynecologist, had recommended postponing surgery and he'll her anemia could be treated. She has received 2 iron infusions, the first on March 11 and the second on March 15. She noted some sore throat after each infusion and has had some respiratory congestion since the second infusion. She's also had some headache which she thought might be a migraine headache triggered by her most recent illness.  She has some intermittent lower abdominal pain that's much milder and more chronic than the acute episodes described above. Over the last month she is also noted intermittent fleeting joint pains in her ankles, knees and hands. She has not noticed any rash, mouth sores, visual change, cough, shortness of breath, chest pain, nausea, vomiting, diarrhea, or dysuria. Other than the iron infusion she has not been on any new medications.  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 She has not been around  any sick contacts that she knows of and she's not had any recent travel.   Review of Systems: Pertinent items are noted in HPI.     . sodium chloride   Intravenous Once  . docusate sodium  100 mg Oral BID  . metroNIDAZOLE  500 mg Oral Q12H  .  morphine injection  4 mg Intravenous Once  . senna  1 tablet Oral BID  . tuberculin  5 Units Intradermal Once    Past Medical History  Diagnosis Date  . Other  specified iron deficiency anemias   . Migraine   . Unspecified essential hypertension   . Anemia     History  Substance Use Topics  . Smoking status: Never Smoker   . Smokeless tobacco: Never Used  . Alcohol Use: No    Family History  Problem Relation Age of Onset  . Arthritis      family history  . Prostate cancer Father   . Diabetes      1st degree relative  . Hypertension      family history   Allergies  Allergen Reactions  . Sulfa Antibiotics Itching    shaky    OBJECTIVE: Blood pressure 151/101, pulse 80, temperature 98.1 F (36.7 C), temperature source Oral, resp. rate 20, height 5\' 7"  (1.702 m), weight 185 lb 13.6 oz (84.3 kg), last menstrual period 03/09/2012, SpO2 100.00%. General: She is a healthy appearing young African American woman. She is in no distress. Skin: She has no rash or palpable adenopathy Mouth: Clear Lungs: Clear Cor: Regular S1 and S2 with no murmurs Abdomen: Her abdomen is soft. She has mild lower quadrant tenderness. I cannot feel any masses. She has quiet bowel sounds. Joints and extremities: Normal  Microbiology: Recent Results (from the past 240 hour(s))  WET PREP, GENITAL     Status: Abnormal   Collection Time   03/10/12  7:03 PM      Component Value Range Status Comment   Yeast Wet Prep HPF POC NONE SEEN  NONE SEEN  Final    Trich, Wet Prep NONE SEEN  NONE SEEN  Final    Clue Cells Wet Prep HPF POC MODERATE (*) NONE SEEN  Final    WBC, Wet Prep HPF POC MANY (*) NONE SEEN  Final     Cliffton Asters, MD [facility] [phone] pager   [phone] cell 03/11/2012, 6:32 PM

## 2012-03-11 NOTE — Progress Notes (Signed)
Subjective: Feels much better now  Objective: Weight change:   Intake/Output Summary (Last 24 hours) at 03/11/12 1528 Last data filed at 03/10/12 2354  Gross per 24 hour  Intake    240 ml  Output      0 ml  Net    240 ml    Filed Vitals:   03/11/12 1142  BP: 133/82  Pulse: 89  Temp:   Resp: 20   General: Patient appears her stated age. Cardiovascular: Regular rate rhythm. Lungs: Clear to auscultation bilaterally. Abdomen: Mild diffuse tenderness, positive bowel sounds. Extremities: No edema  Lab Results: Reviewed  Micro Results: Recent Results (from the past 240 hour(s))  WET PREP, GENITAL     Status: Abnormal   Collection Time   03/10/12  7:03 PM      Component Value Range Status Comment   Yeast Wet Prep HPF POC NONE SEEN  NONE SEEN  Final    Trich, Wet Prep NONE SEEN  NONE SEEN  Final    Clue Cells Wet Prep HPF POC MODERATE (*) NONE SEEN  Final    WBC, Wet Prep HPF POC MANY (*) NONE SEEN  Final     Studies/Results: Dg Knee 1-2 Views Left  03/10/2012  *RADIOLOGY REPORT*  Clinical Data: Bilateral knee pain.  LEFT KNEE - 1-2 VIEW  Comparison: None.  Findings: There is no evidence of fracture or dislocation.  The joint spaces are preserved.  Small wall osteophytes are seen; the patellofemoral joint is grossly unremarkable in appearance.  No significant joint effusion is seen.  The visualized soft tissues are normal in appearance.  IMPRESSION: No evidence of fracture or dislocation.  Original Report Authenticated By: Tonia Ghent, M.D.   Dg Knee 1-2 Views Right  03/10/2012  *RADIOLOGY REPORT*  Clinical Data: Bilateral knee pain.  RIGHT KNEE - 1-2 VIEW  Comparison: None.  Findings: There is no evidence of fracture or dislocation.  The joint spaces are preserved.  No significant degenerative change is seen; the patellofemoral joint is grossly unremarkable in appearance.  No significant joint effusion is seen.  The visualized soft tissues are normal in appearance.   IMPRESSION: No evidence of fracture or dislocation.  Original Report Authenticated By: Tonia Ghent, M.D.   US Abdomen Complete  02/11/2012  *RADIOLOGY REPORT*  Clinical Data:  5-day history of abdominal pain.  COMPLETE ABDOMINAL ULTRASOUND 02/11/2012:  Comparison:  None.  Findings:  Gallbladder:  Echogenic non-shadowing focus adherent to the wall of the body of the gallbladder measuring approximately 0.8 cm.  No shadowing gallstones.  No gallbladder wall thickening or pericholecystic fluid.  Negative sonographic Murphy's sign according to the ultrasound technologist.  Common bile duct:  Normal in caliber with maximum diameter approximating 2 mm.  Liver:  Normal size and echotexture without focal parenchymal abnormality.  Patent portal vein with hepatopetal flow.  IVC:  Patent.  Pancreas:  Normal size and echotexture without focal parenchymal abnormality.  Spleen:  Normal size and echotexture without focal parenchymal abnormality.  Right Kidney:  No hydronephrosis.  Well-preserved cortex.  No shadowing calculi.  Normal size and parenchymal echotexture without focal abnormalities.  Approximately 11.9 cm in length.  Left Kidney:  No hydronephrosis.  Well-preserved cortex.  No shadowing calculi.  Normal size and parenchymal echotexture without focal abnormalities.  Approximately 11.0 cm in length.  Abdominal aorta:  Normal in caliber throughout its visualized course in the abdomen without significant atherosclerosis.  IMPRESSION:  1.  Approximate 0.8 cm gallbladder polyp.  No evidence  of cholelithiasis or cholecystitis. 2.  Otherwise normal examination.  Original Report Authenticated By: Arnell Sieving, M.D.   US Transvaginal Non-ob  02/11/2012  *RADIOLOGY REPORT*  Clinical Data: Left pelvic and left lower quadrant pain.  TRANSABDOMINAL AND TRANSVAGINAL ULTRASOUND OF PELVIS  Technique:  Both transabdominal and transvaginal ultrasound examinations of the pelvis were performed.  Transabdominal technique was  performed for global imaging of the pelvis including uterus, ovaries, adnexal regions, and pelvic cul-de-sac.  It was necessary to proceed with endovaginal exam following the transabdominal exam to visualize the lower uterine fibroids and relationship to endometrium.  Comparison:  12/10/2004  Findings: Uterus:  Measures 14.3 x 8.8 x 9.9 cm, which is increased in size since prior study.  At least five distinct uterine fibroids are visualized and can be measured.  The largest is located in the anterior upper uterine body measuring 5.8 cm in maximum diameter. A second fibroid in the anterior lower uterine segment measures 5.0 cm in maximum diameter. A third fibroid in the anterior mid uterine body measures 3.0 cm in maximum diameter. These anterior fibroids show partial submucosal components displacing the endometrium posteriorly.  Two other fibroids are seen in the uterine fundus measuring 3.7 cm and 2.8 cm, the latter of which is calcified.  Endometrium: 9 mm double layer thickness measured transvaginally.  Right ovary: Normal appearance/no adnexal mass  Left ovary: Normal appearance/no adnexal mass  Other Findings:  No free fluid  IMPRESSION:  1.  Increased size of fibroids and overall uterine volume compared to previous study in 2005.  Several anterior fibroids have partial submucosal components, displacing the endometrium posteriorly. 2.  Normal ovaries.  No evidence of adnexal mass.  Original Report Authenticated By: Danae Orleans, M.D.   US Pelvis Complete  02/11/2012  *RADIOLOGY REPORT*  Clinical Data: Left pelvic and left lower quadrant pain.  TRANSABDOMINAL AND TRANSVAGINAL ULTRASOUND OF PELVIS  Technique:  Both transabdominal and transvaginal ultrasound examinations of the pelvis were performed.  Transabdominal technique was performed for global imaging of the pelvis including uterus, ovaries, adnexal regions, and pelvic cul-de-sac.  It was necessary to proceed with endovaginal exam following the  transabdominal exam to visualize the lower uterine fibroids and relationship to endometrium.  Comparison:  12/10/2004  Findings: Uterus:  Measures 14.3 x 8.8 x 9.9 cm, which is increased in size since prior study.  At least five distinct uterine fibroids are visualized and can be measured.  The largest is located in the anterior upper uterine body measuring 5.8 cm in maximum diameter. A second fibroid in the anterior lower uterine segment measures 5.0 cm in maximum diameter. A third fibroid in the anterior mid uterine body measures 3.0 cm in maximum diameter. These anterior fibroids show partial submucosal components displacing the endometrium posteriorly.  Two other fibroids are seen in the uterine fundus measuring 3.7 cm and 2.8 cm, the latter of which is calcified.  Endometrium: 9 mm double layer thickness measured transvaginally.  Right ovary: Normal appearance/no adnexal mass  Left ovary: Normal appearance/no adnexal mass  Other Findings:  No free fluid  IMPRESSION:  1.  Increased size of fibroids and overall uterine volume compared to previous study in 2005.  Several anterior fibroids have partial submucosal components, displacing the endometrium posteriorly. 2.  Normal ovaries.  No evidence of adnexal mass.  Original Report Authenticated By: Danae Orleans, M.D.   Ct Abdomen Pelvis W Contrast  03/10/2012  *RADIOLOGY REPORT*  Clinical Data: Lower abdominal pain.  History of fibroids  CT ABDOMEN AND PELVIS WITH CONTRAST  Technique:  Multidetector CT imaging of the abdomen and pelvis was performed following the standard protocol during bolus administration of intravenous contrast.  Contrast: OMNIPAQUE IOHEXOL 300 MG/ML IJ SOLN  Comparison: Multiple previous ultrasounds most recently 02/19/2012.  Findings: Lung bases are clear.  No pleural or pericardial fluid. The liver has a normal appearance without focal lesions or biliary ductal dilatation.  No calcified gallstones.  The spleen is normal. The  pancreas is normal.  The adrenal glands are normal.  The kidneys are normal.  The aorta and IVC are normal.  No retroperitoneal mass or adenopathy.  There are some normal sized lymph nodes.  The uterus is enlarged and contains multiple leiomyomas.  Uterine cervical complex measures and total 16 cm in length, 9 cm right to left and 9 cm front to back.  Several leiomyomas show calcification.  Both ovaries appear normal.  Ovarian veins bilaterally are enlarged.  Pelvic sidewall veins are enlarged. This could possibly relate to pelvic congestion syndrome which can cause lower abdominal and pelvic pain.  No free fluid.  The bladder is normal.  IMPRESSION: Marked enlargement of the uterus with multiple leiomyomas. Prominence of the ovarian veins and of the pelvic sidewall veins raising the question of pelvic congestion syndrome, which can cause lower abdominal and pelvic pain.  No other finding of note.  Original Report Authenticated By: Thomasenia Sales, M.D.   Dg Hand 2 View Right  03/10/2012  *RADIOLOGY REPORT*  Clinical Data: Bilateral hand pain.  RIGHT HAND - 2 VIEW  Comparison: None.  Findings: There is no evidence of fracture or dislocation.  The joint spaces are preserved; the soft tissues are unremarkable in appearance.  The carpal rows are intact, and demonstrate normal alignment.  IMPRESSION: No evidence of fracture or dislocation.  Original Report Authenticated By: Tonia Ghent, M.D.   Dg Hand 2 View Left  03/10/2012  *RADIOLOGY REPORT*  Clinical Data: Bilateral hand pain.  LEFT HAND - 2 VIEW  Comparison: None.  Findings: There is no evidence of fracture or dislocation.  The joint spaces are preserved; the soft tissues are unremarkable in appearance.  The carpal rows are intact, and demonstrate normal alignment.  IMPRESSION: No evidence of fracture or dislocation.  Original Report Authenticated By: Tonia Ghent, M.D.   Dg Chest Port 1 View  03/10/2012  *RADIOLOGY REPORT*  Clinical Data: 41 year old  female with cough, fever, abdominal pain.  PORTABLE CHEST - 1 VIEW  Comparison: None.  Findings: Portable semi upright AP view 1538 hours.  Lung volumes are within normal limits.  Cardiac size and mediastinal contours are within normal limits.  Allowing for portable technique, the lungs are clear.  No pneumothorax or pneumoperitoneum.  IMPRESSION: No acute cardiopulmonary abnormality.  Original Report Authenticated By: Harley Hallmark, M.D.   Medications: Scheduled Meds:   . sodium chloride   Intravenous Once  . acetaminophen  650 mg Oral Once  . docusate sodium  100 mg Oral BID  . metroNIDAZOLE  500 mg Oral Q12H  .  morphine injection  4 mg Intravenous Once  .  morphine injection  4 mg Intravenous Once  . ondansetron  4 mg Intravenous Once  . senna  1 tablet Oral BID  . tuberculin  5 Units Intradermal Once   Continuous Infusions:   . DISCONTD: sodium chloride 125 mL/hr at 03/10/12 2034   PRN Meds:.acetaminophen, acetaminophen, iohexol, morphine injection, ondansetron (ZOFRAN) IV, ondansetron  Assessment/Plan: Fever of undetermined  origin (03/10/2012) Leukocytosis (03/10/2012) Pelvic congestion syndrome (03/10/2012)/Bacterial Vaginosis Uterine fibroid (03/10/2012) Start patient on Flagyl 500 twice a day for 7 days for Bacterial Vaginosis. Unsure of the etiology of her fever and leukocytosis. Consulted infectious disease to assist with workup for fever of unknown origin.    LOS: 1 day   Carollee Massed [phone] 03/11/2012, 3:28 PM

## 2012-03-11 NOTE — ED Notes (Signed)
Attempted to call report to 3W, was placed on hold.  Had to call back, was told that nurse is at lunch.

## 2012-03-11 NOTE — ED Notes (Signed)
Dr. Lawson Fiscal contected re pt's elevated Ferritin and C-reactive protein and neg Flu swabs.

## 2012-03-12 DIAGNOSIS — R509 Fever, unspecified: Secondary | ICD-10-CM

## 2012-03-12 LAB — BASIC METABOLIC PANEL
CO2: 26 mEq/L (ref 19–32)
Calcium: 9 mg/dL (ref 8.4–10.5)
Creatinine, Ser: 0.79 mg/dL (ref 0.50–1.10)
GFR calc Af Amer: >90 mL/min (ref >90)
GFR calc non Af Amer: >90 mL/min (ref >90)
Sodium: 136 mEq/L (ref 135–145)

## 2012-03-12 LAB — ANA: Anti Nuclear Antibody(ANA): NEGATIVE

## 2012-03-12 LAB — CBC
MCH: 22.7 pg — ABNORMAL LOW (ref 26.0–34.0)
MCHC: 28.8 g/dL — ABNORMAL LOW (ref 30.0–36.0)
MCV: 79 fL (ref 78.0–100.0)
Platelets: 220 10*3/uL (ref 150–400)
RBC: 3.43 MIL/uL — ABNORMAL LOW (ref 3.87–5.11)
RDW: 26.9 % — ABNORMAL HIGH (ref 11.5–15.5)

## 2012-03-12 LAB — URINE CULTURE

## 2012-03-12 LAB — GLUCOSE, CAPILLARY: Glucose-Capillary: 81 mg/dL (ref 70–99)

## 2012-03-12 MED ORDER — OXYCODONE HCL 5 MG PO TABS: 5.0000 mg | ORAL_TABLET | ORAL | Status: AC | PRN

## 2012-03-12 MED ORDER — SENNA 8.6 MG PO TABS: 1.0000 | ORAL_TABLET | Freq: Two times a day (BID) | ORAL | Status: DC

## 2012-03-12 MED ORDER — DSS 100 MG PO CAPS: 100.0000 mg | ORAL_CAPSULE | Freq: Two times a day (BID) | ORAL | Status: AC

## 2012-03-12 MED ORDER — METRONIDAZOLE 500 MG PO TABS: 500.0000 mg | ORAL_TABLET | Freq: Two times a day (BID) | ORAL | Status: DC

## 2012-03-12 NOTE — Discharge Summary (Signed)
DISCHARGE SUMMARY  Pamela [name]  MR#: [id number]  DOB:[birth year]  Date of Admission: 03/10/2012 Date of Discharge: 03/12/2012  Attending Physician:Keyontae Huckeby  Patient's WUJ:WJXBJYN Copland, MD, MD  Consults: -ID consult  Discharge Diagnoses: Present on Admission:  .Fever of undetermined origin .Leukocytosis .Pelvic congestion syndrome .Uterine fibroid .Recurrent abdominal pain .Iron deficiency anemia .Bacterial vaginosis   Initial presentation:  40yoF with h/o uterine fibroids, anemia, and 2 prior episodes of fevers and abdominal  pain in the past year, now presents with fevers, leukocytosis, abdominal pan.In the ED, pt was febrile to 101.8, tachy to 126, rest of vitals stable. Labs significant  for WBC 22.7 with 92% neutros, Hgb 8.3 with MCV 77. UA was negative. CXR negative. Pelvic  exam was done in the ED, and was negative for cervical motion tenderness, and wet prep  showed many WBC's and moderate clue cells. CT abd/pelvis was done which showed marked  enlargement of uterus and multiple leiomyomas, prominent ovarian veins and pelvic  sidewall veins, question pelvic congestion syndrome. She was admitted for further evalution of leukocytosis , fever and possible pelvic congestion syndrome and bacterial vaginosis.    Hospital Course: 1. Leukocytosis: resolved on discharge. Possibly reactive vs secondary to bacterial vaginosis. But bacterial vaginosis usually does not give this much rise in WBC. She was given prescription for flagyl to complete the course for  Bacterial vaginosis.  2. Fever of unknown origin:  Transient and has resolved. No etiology could be found, except ofr bacterial vaginosis.   3. Pelvic congestion syndrome: her abdominal pain has resolved and could be secondary to pelvic congestion syndrome. Recommended outpatient follow up with her gynecologist.   4. Anemia: continue with ferrous supplements and follow up with gynecologist as outpatient for possible  hysterectomy with her gynecologist.      Medication List  As of 03/12/2012  2:07 PM   TAKE these medications         dextromethorphan-guaiFENesin 30-600 MG per 12 hr tablet   Commonly known as: MUCINEX DM   Take 1 tablet by mouth every 12 (twelve) hours.      DSS 100 MG Caps   Take 100 mg by mouth 2 (two) times daily.      Ferrous Gluconate 325 (36 FE) MG Tabs   Take 1 tablet by mouth 2 (two) times daily.      metroNIDAZOLE 500 MG tablet   Commonly known as: FLAGYL   Take 1 tablet (500 mg total) by mouth every 12 (twelve) hours.      oxyCODONE 5 MG immediate release tablet   Commonly known as: Oxy IR/ROXICODONE   Take 1 tablet (5 mg total) by mouth every 4 (four) hours as needed for pain.      senna 8.6 MG Tabs   Commonly known as: SENOKOT   Take 1 tablet (8.6 mg total) by mouth 2 (two) times daily.      SUMAtriptan 50 MG tablet   Commonly known as: IMITREX   Take 1 tablet (50 mg total) by mouth every 2 (two) hours as needed. (May repeat once)             Day of Discharge BP 136/80  Pulse 76  Temp(Src) 98.5 F (36.9 C) (Oral)  Resp 20  Ht 5\' 7"  (1.702 m)  Wt 84.3 kg (185 lb 13.6 oz)  BMI 29.11 kg/m2  SpO2 99%  LMP 03/09/2012  Physical Exam: General: Patient appears her stated age.  Cardiovascular: Regular rate rhythm.  Lungs: Clear to auscultation  bilaterally.  Abdomen: No tenderness, positive bowel sounds.  Extremities: No edema   Results for orders placed during the hospital encounter of 03/10/12 (from the past 24 hour(s))  CBC     Status: Abnormal   Collection Time   03/12/12  4:08 AM      Component Value Range   WBC 5.6  4.0 - 10.5 (K/uL)   RBC 3.43 (*) 3.87 - 5.11 (MIL/uL)   Hemoglobin 7.8 (*) 12.0 - 15.0 (g/dL)   HCT 91.4 (*) 78.2 - 46.0 (%)   MCV 79.0  78.0 - 100.0 (fL)   MCH 22.7 (*) 26.0 - 34.0 (pg)   MCHC 28.8 (*) 30.0 - 36.0 (g/dL)   RDW 95.6 (*) 21.3 - 15.5 (%)   Platelets 220  150 - 400 (K/uL)  BASIC METABOLIC PANEL     Status:  Abnormal   Collection Time   03/12/12  4:08 AM      Component Value Range   Sodium 136  135 - 145 (mEq/L)   Potassium 3.7  3.5 - 5.1 (mEq/L)   Chloride 104  96 - 112 (mEq/L)   CO2 26  19 - 32 (mEq/L)   Glucose, Bld 90  70 - 99 (mg/dL)   BUN 5 (*) 6 - 23 (mg/dL)   Creatinine, Ser 0.86  0.50 - 1.10 (mg/dL)   Calcium 9.0  8.4 - 57.8 (mg/dL)   GFR calc non Af Amer >90  >90 (mL/min)   GFR calc Af Amer >90  >90 (mL/min)  GLUCOSE, CAPILLARY     Status: Normal   Collection Time   03/12/12  7:46 AM      Component Value Range   Glucose-Capillary 81  70 - 99 (mg/dL)   Comment 1 Notify RN      Disposition: HOME   Follow-up Appts: Discharge Orders    Future Orders Please Complete By Expires   Diet general      Discharge instructions      Comments:   Follow up with Gynecologist  In one to two weeks.   Activity as tolerated - No restrictions         Follow-up Information    Follow up with Hannah Beat, MD in 1 week. (As needed)       Follow up with gynecologist  in 1 week.         Tests Needing Follow-up: CBC in one to two weeks.  Time spent in discharge (includes decision making & examination of pt): 55 minutes  Signed: Burdell Peed 03/12/2012, 2:07 PM

## 2012-03-12 NOTE — Progress Notes (Signed)
DISCHARGE INSTRUCTIONS AND PRESCRIPTIONS GIVEN.  NO QUESTIONS.  VERBALIZED UNDERSTANDING.  LEFT VIA WHEELCHAIR WITH FAMILY

## 2012-03-12 NOTE — Progress Notes (Signed)
Patient ID: Pamela [name], female   DOB: 03/18/1971, 41 y.o.   MRN: [id number]  INFECTIOUS DISEASE PROGRESS NOTE    Date of Admission:  03/10/2012           Day 2 metronidazole for bacterial vaginosis  Subjective: She still has some moderate, intermittent lower abdominal pain that is worse than her baseline but overall she is feeling much better. She has no more headache. She has not had any nausea, vomiting, diarrhea or dysuria.  Objective: Temp:  [98.1 F (36.7 C)-98.5 F (36.9 C)] 98.5 F (36.9 C) (03/22 0743) Pulse Rate:  [71-89] 76  (03/22 0743) Resp:  [18-20] 20  (03/22 0743) BP: (133-151)/(80-101) 136/80 mmHg (03/22 0743) SpO2:  [99 %-100 %] 99 % (03/22 0743) Weight:  [84.3 kg (185 lb 13.6 oz)] 84.3 kg (185 lb 13.6 oz) (03/21 1550)  General: She appears well and in no distress Skin: No rash Lungs: Clear Cor: Regular S1 and S2 no murmurs Abdomen: Mild lower quadrant tenderness   Lab Results Lab Results  Component Value Date   WBC 5.6 03/12/2012   HGB 7.8* 03/12/2012   HCT 27.1* 03/12/2012   MCV 79.0 03/12/2012   PLT 220 03/12/2012     Assessment: I have no clear explanation for her transient fever and leukocytosis. No blood cultures were done but she certainly does not appear to have any systemic infection. I would not do any further evaluation at this time.  Plan: 1. Nothing further to add at this point. 2. I will sign off now. Please call if we can be of further assistance   Cliffton Asters, MD [facility] [phone] pager   [phone] cell 03/12/2012, 10:59 AM

## 2012-03-12 NOTE — Progress Notes (Signed)
TB SKIN TEST READ -- NO MARK.  NEGATIVE

## 2012-03-15 ENCOUNTER — Encounter (HOSPITAL_COMMUNITY): Payer: Self-pay | Admitting: Pharmacist

## 2012-03-17 ENCOUNTER — Other Ambulatory Visit: Payer: Self-pay | Admitting: Obstetrics and Gynecology

## 2012-03-18 ENCOUNTER — Encounter (HOSPITAL_COMMUNITY): Payer: Self-pay

## 2012-03-18 ENCOUNTER — Encounter (HOSPITAL_COMMUNITY)
Admission: RE | Admit: 2012-03-18 | Discharge: 2012-03-18 | Disposition: A | Discharge status: Home / Self Care | Payer: BC Managed Care – PPO | Source: Ambulatory Visit | Attending: Obstetrics and Gynecology | Admitting: Obstetrics and Gynecology

## 2012-03-18 LAB — CBC
Platelets: 334 10*3/uL (ref 150–400)
RBC: 4.09 MIL/uL (ref 3.87–5.11)
WBC: 4.8 10*3/uL (ref 4.0–10.5)

## 2012-03-18 LAB — SURGICAL PCR SCREEN: MRSA, PCR: NEGATIVE

## 2012-03-18 NOTE — Pre-Procedure Instructions (Signed)
Pt was admitted to to [facility] March 20th and d/c 22nd-admitted with c/o abdominal pain, pt states wbc elevated and fever-resolved.

## 2012-03-18 NOTE — Patient Instructions (Addendum)
   Your procedure is scheduled on: Friday Chavonne 5th  Enter through the [employer] of [facility] at: 11:30am Pick up the phone at the desk and dial [phone] and inform us of your arrival.  Please call this number if you have any problems the morning of surgery: [phone]  Remember: Do not eat food after midnight: Thursday Do not drink clear liquids after: 9am Friday Take these medicines the morning of surgery with a SIP OF WATER: none  Do not wear jewelry, make-up, or FINGER nail polish Do not wear lotions, powders, perfumes or deodorant. Do not shave 48 hours prior to surgery. Do not bring valuables to the hospital. Contacts, dentures or bridgework may not be worn into surgery.  Leave suitcase in the car. After Surgery it may be brought to your room. For patients being admitted to the hospital, checkout time is 11:00am the day of discharge.  Patients discharged on the day of surgery will not be allowed to drive home.     Remember to use your hibiclens as instructed.Please shower with 1/2 bottle the evening before your surgery and the other 1/2 bottle the morning of surgery. Neck down avoiding private area.

## 2012-03-19 LAB — ANCA SCREEN W REFLEX TITER: Atypical p-ANCA Screen: NEGATIVE

## 2012-03-25 MED ORDER — CEFAZOLIN SODIUM-DEXTROSE 2-3 GM-% IV SOLR
2.0000 g | INTRAVENOUS | Status: AC
  Administered 2012-03-26: 2 g via INTRAVENOUS
  Filled 2012-03-25: qty 50

## 2012-03-26 ENCOUNTER — Encounter (HOSPITAL_COMMUNITY): Payer: Self-pay | Admitting: Anesthesiology

## 2012-03-26 ENCOUNTER — Encounter (HOSPITAL_COMMUNITY)
Admission: RE | Disposition: A | Discharge status: Home / Self Care | Payer: Self-pay | Source: Ambulatory Visit | Attending: Obstetrics and Gynecology

## 2012-03-26 ENCOUNTER — Ambulatory Visit (HOSPITAL_COMMUNITY): Payer: BC Managed Care – PPO | Admitting: Anesthesiology

## 2012-03-26 ENCOUNTER — Encounter (HOSPITAL_COMMUNITY): Payer: Self-pay | Admitting: *Deleted

## 2012-03-26 ENCOUNTER — Ambulatory Visit (HOSPITAL_COMMUNITY)
Admission: RE | Admit: 2012-03-26 | Discharge: 2012-03-27 | Disposition: A | Discharge status: Home / Self Care | Payer: BC Managed Care – PPO | Source: Ambulatory Visit | Attending: Obstetrics and Gynecology | Admitting: Obstetrics and Gynecology

## 2012-03-26 DIAGNOSIS — D259 Leiomyoma of uterus, unspecified: Secondary | ICD-10-CM | POA: Insufficient documentation

## 2012-03-26 DIAGNOSIS — Z01818 Encounter for other preprocedural examination: Secondary | ICD-10-CM | POA: Insufficient documentation

## 2012-03-26 DIAGNOSIS — N920 Excessive and frequent menstruation with regular cycle: Secondary | ICD-10-CM | POA: Insufficient documentation

## 2012-03-26 DIAGNOSIS — Z01812 Encounter for preprocedural laboratory examination: Secondary | ICD-10-CM | POA: Insufficient documentation

## 2012-03-26 DIAGNOSIS — Z90710 Acquired absence of both cervix and uterus: Secondary | ICD-10-CM | POA: Diagnosis not present

## 2012-03-26 DIAGNOSIS — D500 Iron deficiency anemia secondary to blood loss (chronic): Secondary | ICD-10-CM | POA: Insufficient documentation

## 2012-03-26 LAB — CBC
MCH: 24.9 pg — ABNORMAL LOW (ref 26.0–34.0)
MCV: 81.6 fL (ref 78.0–100.0)
Platelets: 187 10*3/uL (ref 150–400)
RDW: 26.3 % — ABNORMAL HIGH (ref 11.5–15.5)

## 2012-03-26 LAB — ABO/RH: ABO/RH(D): O POS

## 2012-03-26 LAB — HCG, SERUM, QUALITATIVE: Preg, Serum: NEGATIVE

## 2012-03-26 LAB — TYPE AND SCREEN: ABO/RH(D): O POS

## 2012-03-26 SURGERY — ROBOTIC ASSISTED TOTAL HYSTERECTOMY
Anesthesia: General | Site: Abdomen | Wound class: Clean Contaminated

## 2012-03-26 MED ORDER — SODIUM CHLORIDE 0.9 % IJ SOLN: 9.0000 mL | INTRAMUSCULAR | Status: DC | PRN

## 2012-03-26 MED ORDER — ARTIFICIAL TEARS OP OINT
TOPICAL_OINTMENT | OPHTHALMIC | Status: AC
  Filled 2012-03-26: qty 3.5

## 2012-03-26 MED ORDER — DEXAMETHASONE SODIUM PHOSPHATE 10 MG/ML IJ SOLN
INTRAMUSCULAR | Status: DC | PRN
  Administered 2012-03-26: 10 mg via INTRAVENOUS

## 2012-03-26 MED ORDER — FENTANYL CITRATE 0.05 MG/ML IJ SOLN
INTRAMUSCULAR | Status: AC
  Filled 2012-03-26: qty 5

## 2012-03-26 MED ORDER — PROPOFOL 10 MG/ML IV EMUL
INTRAVENOUS | Status: AC
Start: 2012-03-26 — End: 2012-03-26
  Filled 2012-03-26: qty 20

## 2012-03-26 MED ORDER — TRAMADOL HCL 50 MG PO TABS: 50.0000 mg | ORAL_TABLET | Freq: Four times a day (QID) | ORAL | Status: DC | PRN

## 2012-03-26 MED ORDER — PROPOFOL 10 MG/ML IV EMUL
INTRAVENOUS | Status: DC | PRN
  Administered 2012-03-26: 180 mg via INTRAVENOUS

## 2012-03-26 MED ORDER — KETOROLAC TROMETHAMINE 30 MG/ML IJ SOLN: 15.0000 mg | Freq: Once | INTRAMUSCULAR | Status: DC | PRN

## 2012-03-26 MED ORDER — ONDANSETRON HCL 4 MG/2ML IJ SOLN
INTRAMUSCULAR | Status: DC | PRN
  Administered 2012-03-26: 4 mg via INTRAVENOUS

## 2012-03-26 MED ORDER — LACTATED RINGERS IR SOLN
Status: DC | PRN
  Administered 2012-03-26: 3000 mL

## 2012-03-26 MED ORDER — DEXAMETHASONE SODIUM PHOSPHATE 10 MG/ML IJ SOLN
INTRAMUSCULAR | Status: AC
  Filled 2012-03-26: qty 1

## 2012-03-26 MED ORDER — MIDAZOLAM HCL 2 MG/2ML IJ SOLN
INTRAMUSCULAR | Status: AC
Start: 2012-03-26 — End: 2012-03-26
  Filled 2012-03-26: qty 2

## 2012-03-26 MED ORDER — BUPIVACAINE HCL (PF) 0.25 % IJ SOLN
INTRAMUSCULAR | Status: DC | PRN
  Administered 2012-03-26: 15 mL

## 2012-03-26 MED ORDER — HETASTARCH-ELECTROLYTES 6 % IV SOLN
INTRAVENOUS | Status: DC | PRN
  Administered 2012-03-26: 15:00:00 via INTRAVENOUS

## 2012-03-26 MED ORDER — PROMETHAZINE HCL 25 MG/ML IJ SOLN: 6.2500 mg | INTRAMUSCULAR | Status: DC | PRN

## 2012-03-26 MED ORDER — FENTANYL CITRATE 0.05 MG/ML IJ SOLN
INTRAMUSCULAR | Status: AC
  Administered 2012-03-26: 50 ug via INTRAVENOUS
  Filled 2012-03-26: qty 2

## 2012-03-26 MED ORDER — MIDAZOLAM HCL 2 MG/2ML IJ SOLN: 0.5000 mg | Freq: Once | INTRAMUSCULAR | Status: DC | PRN

## 2012-03-26 MED ORDER — OXYCODONE-ACETAMINOPHEN 5-325 MG PO TABS
1.0000 | ORAL_TABLET | ORAL | Status: DC | PRN
  Administered 2012-03-27: 2 via ORAL
  Filled 2012-03-26 (×2): qty 2

## 2012-03-26 MED ORDER — ROCURONIUM BROMIDE 100 MG/10ML IV SOLN
INTRAVENOUS | Status: DC | PRN
  Administered 2012-03-26: 15 mg via INTRAVENOUS
  Administered 2012-03-26: 45 mg via INTRAVENOUS
  Administered 2012-03-26: 5 mg via INTRAVENOUS

## 2012-03-26 MED ORDER — GLYCOPYRROLATE 0.2 MG/ML IJ SOLN
INTRAMUSCULAR | Status: AC
  Filled 2012-03-26: qty 1

## 2012-03-26 MED ORDER — LIDOCAINE HCL (CARDIAC) 20 MG/ML IV SOLN
INTRAVENOUS | Status: DC | PRN
  Administered 2012-03-26: 60 mg via INTRAVENOUS

## 2012-03-26 MED ORDER — GLYCOPYRROLATE 0.2 MG/ML IJ SOLN
INTRAMUSCULAR | Status: DC | PRN
  Administered 2012-03-26: 0.4 mg via INTRAVENOUS
  Administered 2012-03-26: 0.1 mg via INTRAVENOUS
  Administered 2012-03-26: 0.2 mg via INTRAVENOUS

## 2012-03-26 MED ORDER — DIPHENHYDRAMINE HCL 50 MG/ML IJ SOLN
12.5000 mg | Freq: Four times a day (QID) | INTRAMUSCULAR | Status: DC | PRN
  Administered 2012-03-27: 12.5 mg via INTRAVENOUS
  Filled 2012-03-26: qty 1

## 2012-03-26 MED ORDER — NALOXONE HCL 0.4 MG/ML IJ SOLN: 0.4000 mg | INTRAMUSCULAR | Status: DC | PRN

## 2012-03-26 MED ORDER — NEOSTIGMINE METHYLSULFATE 1 MG/ML IJ SOLN
INTRAMUSCULAR | Status: AC
  Filled 2012-03-26: qty 10

## 2012-03-26 MED ORDER — MIDAZOLAM HCL 5 MG/5ML IJ SOLN
INTRAMUSCULAR | Status: DC | PRN
  Administered 2012-03-26: 2 mg via INTRAVENOUS

## 2012-03-26 MED ORDER — ACETAMINOPHEN 325 MG PO TABS: 325.0000 mg | ORAL_TABLET | ORAL | Status: DC | PRN

## 2012-03-26 MED ORDER — HYDROMORPHONE HCL PF 1 MG/ML IJ SOLN
INTRAMUSCULAR | Status: AC
  Administered 2012-03-26: 0.5 mg via INTRAVENOUS
  Filled 2012-03-26: qty 1

## 2012-03-26 MED ORDER — LACTATED RINGERS IV SOLN
INTRAVENOUS | Status: DC
  Administered 2012-03-26 (×2): 125 mL/h via INTRAVENOUS
  Administered 2012-03-26: 900 mL/h via INTRAVENOUS
  Administered 2012-03-26: 125 mL/h via INTRAVENOUS
  Administered 2012-03-26: 15:00:00 via INTRAVENOUS

## 2012-03-26 MED ORDER — FENTANYL CITRATE 0.05 MG/ML IJ SOLN
INTRAMUSCULAR | Status: AC
  Administered 2012-03-26: 25 ug via INTRAVENOUS
  Filled 2012-03-26: qty 2

## 2012-03-26 MED ORDER — NEOSTIGMINE METHYLSULFATE 1 MG/ML IJ SOLN
INTRAMUSCULAR | Status: DC | PRN
  Administered 2012-03-26: 1 mg via INTRAVENOUS
  Administered 2012-03-26: 2 mg via INTRAVENOUS

## 2012-03-26 MED ORDER — ROCURONIUM BROMIDE 50 MG/5ML IV SOLN
INTRAVENOUS | Status: AC
Start: 2012-03-26 — End: 2012-03-26
  Filled 2012-03-26: qty 1

## 2012-03-26 MED ORDER — HYDROMORPHONE HCL PF 1 MG/ML IJ SOLN
0.5000 mg | Freq: Once | INTRAMUSCULAR | Status: AC
  Administered 2012-03-26: 0.5 mg via INTRAVENOUS

## 2012-03-26 MED ORDER — LIDOCAINE HCL (CARDIAC) 20 MG/ML IV SOLN
INTRAVENOUS | Status: AC
  Filled 2012-03-26: qty 5

## 2012-03-26 MED ORDER — HYDROMORPHONE 0.3 MG/ML IV SOLN
INTRAVENOUS | Status: DC
  Administered 2012-03-26: 22:00:00 via INTRAVENOUS
  Administered 2012-03-26: 0.6 mg via INTRAVENOUS
  Administered 2012-03-27: 3 mg via INTRAVENOUS
  Administered 2012-03-27: 1.2 mg via INTRAVENOUS
  Administered 2012-03-27: 0.9 mg via INTRAVENOUS

## 2012-03-26 MED ORDER — ZOLPIDEM TARTRATE 5 MG PO TABS: 5.0000 mg | ORAL_TABLET | Freq: Every evening | ORAL | Status: DC | PRN

## 2012-03-26 MED ORDER — DIPHENHYDRAMINE HCL 12.5 MG/5ML PO ELIX
12.5000 mg | ORAL_SOLUTION | Freq: Four times a day (QID) | ORAL | Status: DC | PRN
  Filled 2012-03-26 (×2): qty 5

## 2012-03-26 MED ORDER — FENTANYL CITRATE 0.05 MG/ML IJ SOLN
INTRAMUSCULAR | Status: DC | PRN
  Administered 2012-03-26: 50 ug via INTRAVENOUS
  Administered 2012-03-26: 100 ug via INTRAVENOUS
  Administered 2012-03-26 (×3): 50 ug via INTRAVENOUS
  Administered 2012-03-26 (×2): 100 ug via INTRAVENOUS

## 2012-03-26 MED ORDER — FENTANYL CITRATE 0.05 MG/ML IJ SOLN
25.0000 ug | INTRAMUSCULAR | Status: DC | PRN
  Administered 2012-03-26: 50 ug via INTRAVENOUS
  Administered 2012-03-26: 25 ug via INTRAVENOUS
  Administered 2012-03-26: 50 ug via INTRAVENOUS
  Administered 2012-03-26: 25 ug via INTRAVENOUS

## 2012-03-26 MED ORDER — ONDANSETRON HCL 4 MG/2ML IJ SOLN: 4.0000 mg | Freq: Four times a day (QID) | INTRAMUSCULAR | Status: DC | PRN

## 2012-03-26 MED ORDER — HETASTARCH-ELECTROLYTES 6 % IV SOLN
INTRAVENOUS | Status: AC
  Filled 2012-03-26: qty 500

## 2012-03-26 MED ORDER — INDIGOTINDISULFONATE SODIUM 8 MG/ML IJ SOLN
INTRAMUSCULAR | Status: AC
  Filled 2012-03-26: qty 5

## 2012-03-26 MED ORDER — HYDROMORPHONE 0.3 MG/ML IV SOLN
INTRAVENOUS | Status: AC
  Filled 2012-03-26: qty 25

## 2012-03-26 MED ORDER — ONDANSETRON HCL 4 MG/2ML IJ SOLN
INTRAMUSCULAR | Status: AC
  Filled 2012-03-26: qty 2

## 2012-03-26 MED ORDER — VASOPRESSIN 20 UNIT/ML IJ SOLN
INTRAMUSCULAR | Status: AC
  Filled 2012-03-26: qty 1

## 2012-03-26 MED ORDER — BUPIVACAINE HCL (PF) 0.25 % IJ SOLN
INTRAMUSCULAR | Status: AC
  Filled 2012-03-26: qty 30

## 2012-03-26 MED ORDER — DEXTROSE IN LACTATED RINGERS 5 % IV SOLN
INTRAVENOUS | Status: DC
  Administered 2012-03-26 – 2012-03-27 (×2): via INTRAVENOUS

## 2012-03-26 MED ORDER — MEPERIDINE HCL 25 MG/ML IJ SOLN: 6.2500 mg | INTRAMUSCULAR | Status: DC | PRN

## 2012-03-26 MED ORDER — ACETAMINOPHEN 10 MG/ML IV SOLN
1000.0000 mg | Freq: Once | INTRAVENOUS | Status: AC
  Administered 2012-03-26: 1000 mg via INTRAVENOUS
  Filled 2012-03-26: qty 100

## 2012-03-26 SURGICAL SUPPLY — 71 items
BAG URINE DRAINAGE (UROLOGICAL SUPPLIES) ×3 IMPLANT
BARRIER ADHS 3X4 INTERCEED (GAUZE/BANDAGES/DRESSINGS) ×3 IMPLANT
BLADE LAP MORCELLATOR 15X9.5 (ELECTROSURGICAL) ×3 IMPLANT
CABLE HIGH FREQUENCY MONO STRZ (ELECTRODE) ×3 IMPLANT
CATH FOLEY 3WAY  5CC 16FR (CATHETERS) ×1
CATH FOLEY 3WAY 5CC 16FR (CATHETERS) ×2 IMPLANT
CHLORAPREP W/TINT 26ML (MISCELLANEOUS) ×3 IMPLANT
CLOTH BEACON ORANGE TIMEOUT ST (SAFETY) ×3 IMPLANT
CONT PATH 16OZ SNAP LID 3702 (MISCELLANEOUS) ×3 IMPLANT
COVER MAYO STAND STRL (DRAPES) ×3 IMPLANT
COVER TABLE BACK 60X90 (DRAPES) ×6 IMPLANT
COVER TIP SHEARS 8 DVNC (MISCELLANEOUS) ×2 IMPLANT
COVER TIP SHEARS 8MM DA VINCI (MISCELLANEOUS) ×1
DECANTER SPIKE VIAL GLASS SM (MISCELLANEOUS) ×3 IMPLANT
DERMABOND ADVANCED (GAUZE/BANDAGES/DRESSINGS) ×1
DERMABOND ADVANCED .7 DNX12 (GAUZE/BANDAGES/DRESSINGS) ×2 IMPLANT
DRAPE HUG U DISPOSABLE (DRAPE) ×3 IMPLANT
DRAPE LG THREE QUARTER DISP (DRAPES) ×6 IMPLANT
DRAPE MONITOR DA VINCI (DRAPE) IMPLANT
DRAPE WARM FLUID 44X44 (DRAPE) ×3 IMPLANT
ELECT REM PT RETURN 9FT ADLT (ELECTROSURGICAL) ×3
ELECTRODE REM PT RTRN 9FT ADLT (ELECTROSURGICAL) ×2 IMPLANT
EVACUATOR SMOKE 8.L (FILTER) ×3 IMPLANT
GAUZE VASELINE 3X9 (GAUZE/BANDAGES/DRESSINGS) IMPLANT
GLOVE BIO SURGEON STRL SZ7.5 (GLOVE) ×9 IMPLANT
GOWN PREVENTION PLUS XLARGE (GOWN DISPOSABLE) ×3 IMPLANT
GOWN STRL REIN XL XLG (GOWN DISPOSABLE) ×18 IMPLANT
GYRUS RUMI II 2.5CM BLUE (DISPOSABLE)
GYRUS RUMI II 3.5CM BLUE (DISPOSABLE) ×3
GYRUS RUMI II 4.0CM BLUE (DISPOSABLE)
KIT ACCESSORY DA VINCI DISP (KITS) ×1
KIT ACCESSORY DVNC DISP (KITS) ×2 IMPLANT
KIT DISP ACCESSORY 4 ARM (KITS) IMPLANT
NEEDLE INSUFFLATION 14GA 120MM (NEEDLE) ×3 IMPLANT
PACK LAVH (CUSTOM PROCEDURE TRAY) ×3 IMPLANT
PAD PREP 24X48 CUFFED NSTRL (MISCELLANEOUS) ×6 IMPLANT
PLUG CATH AND CAP STER (CATHETERS) ×3 IMPLANT
PROTECTOR NERVE ULNAR (MISCELLANEOUS) ×6 IMPLANT
RUMI II 3.0CM BLUE KOH-EFFICIE (DISPOSABLE) IMPLANT
RUMI II GYRUS 2.5CM BLUE (DISPOSABLE) IMPLANT
RUMI II GYRUS 3.5CM BLUE (DISPOSABLE) ×2 IMPLANT
RUMI II GYRUS 4.0CM BLUE (DISPOSABLE) IMPLANT
SET CYSTO W/LG BORE CLAMP LF (SET/KITS/TRAYS/PACK) ×3 IMPLANT
SET IRRIG TUBING LAPAROSCOPIC (IRRIGATION / IRRIGATOR) ×3 IMPLANT
SOLUTION ELECTROLUBE (MISCELLANEOUS) ×3 IMPLANT
SPONGE LAP 18X18 X RAY DECT (DISPOSABLE) IMPLANT
SUT VIC AB 0 CT1 27 (SUTURE) ×4
SUT VIC AB 0 CT1 27XBRD ANBCTR (SUTURE) ×4 IMPLANT
SUT VIC AB 0 CT1 27XBRD ANTBC (SUTURE) ×4 IMPLANT
SUT VICRYL 0 UR6 27IN ABS (SUTURE) ×6 IMPLANT
SUT VICRYL RAPIDE 4/0 PS 2 (SUTURE) ×18 IMPLANT
SUT VLOC 180 0 9IN  GS21 (SUTURE) ×1
SUT VLOC 180 0 9IN GS21 (SUTURE) ×2 IMPLANT
SYR 50ML LL SCALE MARK (SYRINGE) ×3 IMPLANT
SYRINGE 10CC LL (SYRINGE) ×3 IMPLANT
SYSTEM CONVERTIBLE TROCAR (TROCAR) IMPLANT
TIP RUMI ORANGE 6.7MMX12CM (TIP) ×3 IMPLANT
TIP UTERINE 5.1X6CM LAV DISP (MISCELLANEOUS) IMPLANT
TIP UTERINE 6.7X10CM GRN DISP (MISCELLANEOUS) IMPLANT
TIP UTERINE 6.7X6CM WHT DISP (MISCELLANEOUS) IMPLANT
TIP UTERINE 6.7X8CM BLUE DISP (MISCELLANEOUS) IMPLANT
TOWEL OR 17X24 6PK STRL BLUE (TOWEL DISPOSABLE) ×9 IMPLANT
TROCAR DISP BLADELESS 8 DVNC (TROCAR) ×2 IMPLANT
TROCAR DISP BLADELESS 8MM (TROCAR) ×1
TROCAR XCEL 12X100 BLDLESS (ENDOMECHANICALS) ×3 IMPLANT
TROCAR XCEL NON-BLD 5MMX100MML (ENDOMECHANICALS) ×3 IMPLANT
TROCAR Z-THREAD 12X150 (TROCAR) ×3 IMPLANT
TROCAR Z-THREAD FIOS 12X100MM (TROCAR) IMPLANT
TUBING FILTER THERMOFLATOR (ELECTROSURGICAL) ×3 IMPLANT
WARMER LAPAROSCOPE (MISCELLANEOUS) ×3 IMPLANT
WATER STERILE IRR 1000ML POUR (IV SOLUTION) ×9 IMPLANT

## 2012-03-26 NOTE — Op Note (Signed)
03/26/2012  5:06 PM  PATIENT:  Pamela [name]  41 y.o. female  PRE-OPERATIVE DIAGNOSIS:  Symptomatic Fibroids, Anemia  POST-OPERATIVE DIAGNOSIS:  Symptomatic Fibroids, Anemia  PROCEDURE:  Procedure(s): ROBOTIC ASSISTED TOTAL HYSTERECTOMY BILATERAL SALPINGECTOMY   SURGEON:  Whitnee Orzel  ASSISTANTS: Cousins   ANESTHESIA:   local and general  ESTIMATED BLOOD LOSS: 250cc  DRAINS: Urinary Catheter (Foley)   LOCAL MEDICATIONS USED:  MARCAINE     SPECIMEN:  Source of Specimen:  uterus and cervix and tubes  DISPOSITION OF SPECIMEN:  PATHOLOGY  COUNTS:  YES  DICTATION #: [id number]  PLAN OF CARE: DC in am  PATIENT DISPOSITION:  PACU - hemodynamically stable.

## 2012-03-26 NOTE — H&P (Signed)
Pamela [name], ZABAWA NO.:  [id number]  MEDICAL RECORD NO.:  [id number]  LOCATION:  PERIO                         FACILITY:  [facility]  PHYSICIAN:  Lenoard Aden, M.D.DATE OF BIRTH:  [birth year]  DATE OF ADMISSION:  03/12/2012 DATE OF DISCHARGE:                             HISTORY & PHYSICAL   CHIEF COMPLAINT:  Menometrorrhagia, symptomatic fibroids secondary to anemia.  HISTORY OF PRESENT ILLNESS:  She is a 41 year old African American female, G5, P4, with history of C-section x2, who presents with symptomatic fibroids, profound anemia, for definitive therapy.  ALLERGIES:  She has allergies to SULFA.  MEDICATIONS:  Oxycodone as needed, iron supplementation, and Imitrex p.r.n. for migraine headaches.  PREVIOUS PREGNANCIES:  Remarkable for 5 pregnancies, 1 miscarriage, 2 vaginal deliveries, 2 C-sections.  SURGICAL HISTORY:  Remarkable for abdominoplasty in 2006, tubal ligation in 2005 __________.  FAMILY HISTORY:  Breast cancer, chronic hypertension, and diabetes.  PHYSICAL EXAMINATION:  GENERAL:  She is a well-developed, well-nourished [place] American female, in no acute distress. VITAL SIGNS:  Height is 67-1/2 inches, weight of 182 pounds. HEENT:  Normal. NECK:  Supple.  Full range of motion. ABDOMEN:  Soft, scaphoid, nontender.  No CVA tenderness. EXTREMITIES:  __________. NEUROLOGIC:  Nonfocal. SKIN:  Intact. PELVIC:  12-14 week size uterus and no adnexal masses.  IMPRESSION:  Symptomatic uterine fibroids with menometrorrhagia secondary to anemia with a starting  hemoglobin of 6 status post __________ transfusion x2, most recent hemoglobin 10.  PLAN:  Plan will be to proceed with da Vinci assisted total laparoscopic hysterectomy, bilateral salpingectomy.  Risks of anesthesia, infection, bleeding, injury to abdominal organs, and need for repair was discussed. Delayed versus immediate complications to include bowel and bladder injury noted.  The  patient acknowledges and wishes to proceed.  Please note patient has a  probable need for blood transfusion with small risk of hepatitis, HIV, and infectious diseases noted.  The patient acknowledges and wishes to proceed.     Lenoard Aden, M.D.     RJT/MEDQ  D:  03/25/2012  T:  03/25/2012  Job:  [id number]

## 2012-03-26 NOTE — Anesthesia Preprocedure Evaluation (Addendum)
Anesthesia Evaluation  Patient identified by MRN, date of birth, ID band Patient awake    Reviewed: Allergy & Precautions, H&P , Patient's Chart, lab work & pertinent test results, reviewed documented beta blocker date and time   History of Anesthesia Complications Negative for: history of anesthetic complications  Airway Mallampati: II TM Distance: >3 FB Neck ROM: full    Dental No notable dental hx.    Pulmonary neg pulmonary ROS,  breath sounds clear to auscultation  Pulmonary exam normal       Cardiovascular Exercise Tolerance: Good negative cardio ROS  Rhythm:regular Rate:Normal     Neuro/Psych  Headaches, negative neurological ROS  negative psych ROS   GI/Hepatic negative GI ROS, Neg liver ROS,   Endo/Other  negative endocrine ROS  Renal/GU negative Renal ROS     Musculoskeletal   Abdominal   Peds  Hematology negative hematology ROS (+)   Anesthesia Other Findings Anemia   Reproductive/Obstetrics negative OB ROS                         Anesthesia Physical Anesthesia Plan  ASA: II  Anesthesia Plan: General ETT   Post-op Pain Management:    Induction:   Airway Management Planned:   Additional Equipment:   Intra-op Plan:   Post-operative Plan:   Informed Consent: I have reviewed the patients History and Physical, chart, labs and discussed the procedure including the risks, benefits and alternatives for the proposed anesthesia with the patient or authorized representative who has indicated his/her understanding and acceptance.   Dental Advisory Given  Plan Discussed with: CRNA and Surgeon  Anesthesia Plan Comments:         Anesthesia Quick Evaluation

## 2012-03-26 NOTE — Anesthesia Postprocedure Evaluation (Signed)
Anesthesia Post Note  Patient: Pamela [name]  Procedure(s) Performed: Procedure(s) (LRB): ROBOTIC ASSISTED TOTAL HYSTERECTOMY (N/A) BILATERAL SALPINGECTOMY (Bilateral)  Anesthesia type: General  Patient location: PACU  Post pain: Pain level controlled  Post assessment: Post-op Vital signs reviewed  Last Vitals:  Filed Vitals:   03/26/12 1845  BP: 133/75  Pulse: 85  Temp:   Resp: 17    Post vital signs: Reviewed  Level of consciousness: sedated  Complications: No apparent anesthesia complicationsfj

## 2012-03-26 NOTE — Transfer of Care (Signed)
Immediate Anesthesia Transfer of Care Note  Patient: Pamela [name]  Procedure(s) Performed: Procedure(s) (LRB): ROBOTIC ASSISTED TOTAL HYSTERECTOMY (N/A) BILATERAL SALPINGECTOMY (Bilateral)  Patient Location: PACU  Anesthesia Type: General  Level of Consciousness: sedated  Airway & Oxygen Therapy: Patient Spontanous Breathing and Patient connected to nasal cannula oxygen  Post-op Assessment: Report given to PACU RN  Post vital signs: Reviewed and stable  Complications: No apparent anesthesia complications

## 2012-03-26 NOTE — Progress Notes (Signed)
Patient ID: Pamela [name], female   DOB: 08-15-1971, 41 y.o.   MRN: [id number] Patient seen and examined. Consent witnessed and signed. No changes noted. Update completed.  Transfusion risks discussed and noted.

## 2012-03-27 DIAGNOSIS — Z90710 Acquired absence of both cervix and uterus: Secondary | ICD-10-CM | POA: Diagnosis not present

## 2012-03-27 LAB — CBC
MCHC: 28.7 g/dL — ABNORMAL LOW (ref 30.0–36.0)
Platelets: 213 10*3/uL (ref 150–400)

## 2012-03-27 MED ORDER — TRAMADOL HCL 50 MG PO TABS: 50.0000 mg | ORAL_TABLET | Freq: Four times a day (QID) | ORAL | Status: AC | PRN

## 2012-03-27 MED ORDER — OXYCODONE-ACETAMINOPHEN 5-325 MG PO TABS: 1.0000 | ORAL_TABLET | ORAL | Status: AC | PRN

## 2012-03-27 NOTE — Addendum Note (Signed)
Addendum  created 03/27/12 1007 by Lincoln Brigham, CRNA   Modules edited:Notes Section

## 2012-03-27 NOTE — Op Note (Signed)
[name], DUE NO.:  [id number]  MEDICAL RECORD NO.:  [id number]  LOCATION:  9319                          FACILITY:  [facility]  PHYSICIAN:  Pamela [name], M.D.DATE OF BIRTH:  20-Sep-1971  DATE OF PROCEDURE:  03/26/2012 DATE OF DISCHARGE:                              OPERATIVE REPORT   PROCEDURE:  After being apprised of risks of anesthesia, infection, bleeding, injury to abdominal organs, need for repair, delayed versus immediate complications include bowel and bladder injury, possible need for repair, probable need for blood transfusion, given severe preoperative anemia, the patient was brought to the operating room, where she was administered general anesthetic without complications. Prepped and draped in usual sterile fashion.  Foley catheter placed. After achieving adequate anesthesia, exam under anesthesia reveals a 14- 16 week size uterus and no adnexal masses.  The uterus was slightly mobile to palpation.  The RUMI retractors placed per vagina.  Foley catheter placed.  A supraumbilical incision was made with a scalpel. Veress needle placed at opening pressure -2 noted, 3-1/2 L CO2 insufflated without difficulty.  Visualization reveals an enlarged uterus, bilateral normal ovaries, normal tubes, anterior and posterior cul-de-sac were clear of fibroids.  The uterus was quite globular and occupies the entire pelvic cavity.  Two accessory ports were made to the left than the right for all 3 robotic arms and an assistant port, 3, 8 mm and a 5 mm placed.  At this time, the robot was docked in standard fashion after achieving deep Trendelenburg position and at this time, the PK EndoShears and graspers were placed.  The uterus was manipulated to the left.  The ureters identified.  The left tube was identified and divided along the mesosalpinx and removed.  The left ovarian ligament was then cauterized and divided.  The round ligament was entered.   The retroperitoneal space was entered.  Then, the ureter was identified on the left.  The uterine vessels were skeletonized.  The bladder flap was developed and the uterus was then manipulated to the right, where the right tube was cauterized, divided along the mesosalpinx and removed. The ovarian ligament was cauterized and divided.  The round ligament was opened.  The retroperitoneal space was entered.  The ureter was identified.  The uterine vessels on the right were skeletonized.  The bladder flap was further developed.  At this time,  circumferentially the RUMI cup was palpable and the attention was turned posteriorly whereby the RUMI cup was exposed along the posterior border.  The uterine vessels were then further cauterized and divided and the RUMI cup was circumferentially divided.  The specimen was retracted into the abdomen and RUMI retractor was removed.  Irrigation was accomplished. The incision was closed using a 0 V lock suture in a continuous running fashion standard closure, good hemostasis.  Culdoplasty performed.  At this time, morcellation was accomplished for a total removal with 651 g specimen.  The ovaries appeared normal.  The pedicles all appeared hemostatic after achieving all morcellation.  The incisions were closed using 0 Vicryl, 4-0 Vicryl and a pressure dressing.  Good hemostasis was noted.  Urine output was clear.  The patient tolerated  the procedure well, was awakened and transferred to recovery in good condition.     Pamela [name], M.D.    RJT/MEDQ  D:  03/26/2012  T:  03/27/2012  Job:  [id number]

## 2012-03-27 NOTE — Progress Notes (Signed)
1 Day Post-Op Procedure(s) (LRB): ROBOTIC ASSISTED TOTAL HYSTERECTOMY (N/A) BILATERAL SALPINGECTOMY (Bilateral)  Subjective: Patient reports nausea, incisional pain, tolerating PO, + flatus and no problems voiding.    Objective: I have reviewed patient's vital signs, intake and output, medications and labs.  General: alert, cooperative and appears stated age Resp: clear to auscultation bilaterally and normal percussion bilaterally Cardio: regular rate and rhythm, S1, S2 normal, no murmur, click, rub or gallop and normal apical impulse GI: soft, non-tender; bowel sounds normal; no masses,  no organomegaly and incision: clean, dry and intact Extremities: extremities normal, atraumatic, no cyanosis or edema and Homans sign is negative, no sign of DVT Vaginal Bleeding: minimal  Assessment: s/p Procedure(s) (LRB): ROBOTIC ASSISTED TOTAL HYSTERECTOMY (N/A) BILATERAL SALPINGECTOMY (Bilateral): stable, progressing well and tolerating diet  Plan: Advance diet Encourage ambulation Advance to PO medication Discontinue IV fluids Discharge home  LOS: 1 day    Pamela [name] J 03/27/2012, 8:11 AM

## 2012-03-27 NOTE — Progress Notes (Signed)
Discharge instructions reviewed with pt.  Pt. States understanding of home care.  No home equipment needed.  Discharged  home with family.

## 2012-03-27 NOTE — Anesthesia Postprocedure Evaluation (Signed)
  Anesthesia Post-op Note  Patient: Pamela [name]  Procedure(s) Performed: Procedure(s) (LRB): ROBOTIC ASSISTED TOTAL HYSTERECTOMY (N/A) BILATERAL SALPINGECTOMY (Bilateral)  Patient Location: Women's Unit  Anesthesia Type: General  Level of Consciousness: awake, alert  and oriented  Airway and Oxygen Therapy: Patient Spontanous Breathing and Patient connected to nasal cannula oxygen  Post-op Pain: moderate  Post-op Assessment: Patient's Cardiovascular Status Stable, Respiratory Function Stable, Patent Airway, No signs of Nausea or vomiting, Adequate PO intake and Pain level controlled  Post-op Vital Signs: stable  Complications: No apparent anesthesia complications

## 2012-03-27 NOTE — Discharge Instructions (Signed)
Hysterectomy Care After Refer to this sheet in the next few weeks. These instructions provide you with information on caring for yourself after your procedure. Your caregiver may also give you more specific instructions. Your treatment has been planned according to current medical practices, but problems sometimes occur. Call your caregiver if you have any problems or questions after your procedure. HOME CARE INSTRUCTIONS  Healing will take time. You may have discomfort, tenderness, swelling, and bruising at the surgical site for about 2 weeks. This is normal and will get better as time goes on.  Only take over-the-counter or prescription medicines for pain, discomfort, or fever as directed by your caregiver.   Do not take aspirin. It can cause bleeding.   Do not drive when taking pain medicine.   Follow your caregiver's advice regarding exercise, lifting, driving, and general activities.   Resume your usual diet as directed and allowed.   Get plenty of rest and sleep.   Do not douche, use tampons, or have sexual intercourse for at least 6 weeks or until your caregiver gives you permission.   Change your bandages (dressings) as directed by your caregiver.   Monitor your temperature.   Take showers instead of baths for 2 to 3 weeks.   Do not drink alcohol until your caregiver gives you permission.   If you are constipated, you may take a mild laxative with your caregiver's permission. Bran foods may help with constipation problems. Drinking enough fluids to keep your urine clear or pale yellow may help as well.   Try to have someone home with you for 1 or 2 weeks to help around the house.   Keep all of your follow-up appointments as directed by your caregiver.  SEEK MEDICAL CARE IF:   You have swelling, redness, or increasing pain in the surgical cut (incision) area.   You have pus coming from the incision.   You notice a bad smell coming from the incision or dressing.   You  have swelling, redness, or pain around the intravenous (IV) site.   Your incision breaks open.   You feel dizzy or lightheaded.   You have pain or bleeding when you urinate.   You have persistent diarrhea.   You have persistent nausea and vomiting.   You have abnormal vaginal discharge.   You have a rash.   You have any type of abnormal reaction or develop an allergy to your medicine.   Your pain is not controlled with your prescribed medicine.  SEEK IMMEDIATE MEDICAL CARE IF:   You have a fever 100.4 or greater.   You have severe abdominal pain.   You have chest pain.   You have shortness of breath.   You faint.   You have pain, swelling, or redness of your leg.   You have heavy vaginal bleeding with blood clots.  MAKE SURE YOU:  Understand these instructions.   Will watch your condition.   Will get help right away if you are not doing well or get worse.  Document Released: 06/27/2005 Document Revised: 11/27/2011 Document Reviewed: 07/25/2011 [facility] Patient Information 2012 [place], [place].Hysterectomy Information  A hysterectomy is a procedure where your uterus is surgically removed. It will no longer be possible to have menstrual periods or to become pregnant. The tubes and ovaries can be removed (bilateral salpingo-oopherectomy) during this surgery as well.  REASONS FOR A HYSTERECTOMY  Persistent, abnormal bleeding.   Lasting (chronic) pelvic pain or infection.   The lining of the uterus (  endometrium) starts growing outside the uterus (endometriosis).   The endometrium starts growing in the muscle of the uterus (adenomyosis).   The uterus falls down into the vagina (pelvic organ prolapse).   Symptomatic uterine fibroids.   Precancerous cells.   Cervical cancer or uterine cancer.  TYPES OF HYSTERECTOMIES  Supracervical hysterectomy. This type removes the top part of the uterus, but not the cervix.   Total hysterectomy. This type removes the uterus  and cervix.   Radical hysterectomy. This type removes the uterus, cervix, and the fibrous tissue that holds the uterus in place in the pelvis (parametrium).  WAYS A HYSTERECTOMY CAN BE PERFORMED  Abdominal hysterectomy. A large surgical cut (incision) is made in the abdomen. The uterus is removed through this incision.   Vaginal hysterectomy. An incision is made in the vagina. The uterus is removed through this incision. There are no abdominal incisions.   Conventional laparoscopic hysterectomy. A thin, lighted tube with a camera (laparoscope) is inserted into 3 or 4 small incisions in the abdomen. The uterus is cut into small pieces. The small pieces are removed through the incisions, or they are removed through the vagina.   Laparoscopic assisted vaginal hysterectomy (LAVH). Three or four small incisions are made in the abdomen. Part of the surgery is performed laparoscopically and part vaginally. The uterus is removed through the vagina.   Robot-assisted laparoscopic hysterectomy. A laparoscope is inserted into 3 or 4 small incisions in the abdomen. A computer-controlled device is used to give the surgeon a 3D image. This allows for more precise movements of surgical instruments. The uterus is cut into small pieces and removed through the incisions or removed through the vagina.  RISKS OF HYSTERECTOMY   Bleeding and risk of blood transfusion. Tell your caregiver if you do not want to receive any blood products.   Blood clots in the legs or lung.   Infection.   Injury to surrounding organs.   Anesthesia problems or side effects.   Conversion to an abdominal hysterectomy.  WHAT TO EXPECT AFTER A HYSTERECTOMY  You will be given pain medicine.   You will need to have someone with you for the first 3 to 5 days after you go home.   You will need to follow up with your surgeon in 2 to 4 weeks after surgery to evaluate your progress.   You may have early menopause symptoms like hot  flashes, night sweats, and insomnia.   If you had a hysterectomy for a problem that was not a cancer or a condition that could lead to cancer, then you no longer need Pap tests. However, even if you no longer need a Pap test, a regular exam is a good idea to make sure no other problems are starting.  Document Released: 06/03/2001 Document Revised: 11/27/2011 Document Reviewed: 07/19/2011 [facility] Patient Information 2012 [place], [place].

## 2012-08-25 ENCOUNTER — Encounter: Payer: Self-pay | Admitting: Family Medicine

## 2012-08-25 ENCOUNTER — Ambulatory Visit (INDEPENDENT_AMBULATORY_CARE_PROVIDER_SITE_OTHER): Payer: BC Managed Care – PPO | Admitting: Family Medicine

## 2012-08-25 VITALS — BP 142/90 | HR 84 | Resp 16 | Wt 183.8 lb

## 2012-08-25 DIAGNOSIS — L659 Nonscarring hair loss, unspecified: Secondary | ICD-10-CM

## 2012-08-25 DIAGNOSIS — D649 Anemia, unspecified: Secondary | ICD-10-CM

## 2012-08-25 LAB — CBC WITH DIFFERENTIAL/PLATELET
Basophils Absolute: 0 10*3/uL (ref 0.0–0.1)
Eosinophils Absolute: 0 10*3/uL (ref 0.0–0.7)
HCT: 36.1 % (ref 36.0–46.0)
Hemoglobin: 11.7 g/dL — ABNORMAL LOW (ref 12.0–15.0)
Lymphs Abs: 1.9 10*3/uL (ref 0.7–4.0)
MCHC: 32.4 g/dL (ref 30.0–36.0)
Monocytes Absolute: 0.5 10*3/uL (ref 0.1–1.0)
Neutro Abs: 3.3 10*3/uL (ref 1.4–7.7)
RDW: 13.8 % (ref 11.5–14.6)

## 2012-08-25 NOTE — Progress Notes (Signed)
[facility] [address] [place] [address] Phone: [phone] Fax: [phone]  Date:  08/25/2012   Name:  Pamela [name]   DOB:  July 06, 1971   MRN:  [id number] Gender: female Age: 41 y.o.  PCP:  Hannah Beat, MD    Chief Complaint: Alopecia   History of Present Illness:  Pamela [name] is a 41 y.o. pleasant patient who presents with the following:  Hair loss, was shedding a whole lot. Hair dresser noticed. She and her hairdresser both noticed that her hair is been coming out in significant clumps. Quite thin compared to how what was about 6 months ago. No known cause. She takes and uses no chemicals or hair treatments. No weaves. No extensions.  Is anemic somewhat.  No other known cause, and clumps of hair has come out.  Now piles of hair.   CBC:    Component Value Date/Time   WBC 5.7 08/25/2012 1238   HGB 11.7* 08/25/2012 1238   HCT 36.1 08/25/2012 1238   PLT 199.0 08/25/2012 1238   MCV 89.3 08/25/2012 1238   NEUTROABS 3.3 08/25/2012 1238   LYMPHSABS 1.9 08/25/2012 1238   MONOABS 0.5 08/25/2012 1238   EOSABS 0.0 08/25/2012 1238   BASOSABS 0.0 08/25/2012 1238    HgB 8 on last ov   Headaches are coming back, has haeadache almost every day for the last couple of weeks, and has ben taking a lot of alleve. When wearing things on her head, will make it hurt. Headache now for a few weeks.   Patient Active Problem List  Diagnosis  . HYPERTENSION  . Migraine headache  . Fever of undetermined origin  . Leukocytosis  . Pelvic congestion syndrome  . Uterine fibroid  . Recurrent abdominal pain  . Iron deficiency anemia  . Bacterial vaginosis  . S/P hysterectomy - Davinci    Past Medical History  Diagnosis Date  . Other specified iron deficiency anemias   . Migraine   . Anemia     Past Surgical History  Procedure Date  . Cesarean section [id number]  . Abdominoplasty 2005  . Breast lift 2005  . Tubal ligation 2005  . Svd     x 2  . Wisdom tooth  extraction     History  Substance Use Topics  . Smoking status: Never Smoker   . Smokeless tobacco: Never Used  . Alcohol Use: No    Family History  Problem Relation Age of Onset  . Arthritis      family history  . Prostate cancer Father   . Diabetes      1st degree relative  . Hypertension      family history    Allergies  Allergen Reactions  . Sulfa Antibiotics Itching    shaky    Medication list has been reviewed and updated.  Current Outpatient Prescriptions on File Prior to Visit  Medication Sig Dispense Refill  . Ferrous Gluconate 325 (36 FE) MG TABS Take 1 tablet by mouth 2 (two) times daily.  60 tablet  5  . SUMAtriptan (IMITREX) 50 MG tablet Take 1 tablet (50 mg total) by mouth every 2 (two) hours as needed. (May repeat once)  10 tablet  5    Review of Systems:  Fatigue, no cp, no nausea  Physical Examination: Filed Vitals:   08/25/12 1140  BP: 142/90  Pulse: 84  Resp: 16   Filed Vitals:   08/25/12 1140  Weight: 183 lb  12 oz (83.348 kg)   There is no height on file to calculate BMI. Ideal Body Weight:     GEN: WDWN, NAD, Non-toxic, A & O x 3 HEENT: Atraumatic, Normocephalic. Neck supple. No masses, No LAD. Ears and Nose: No external deformity. CV: RRR, No M/G/R. No JVD. No thrill. No extra heart sounds. PULM: CTA B, no wheezes, crackles, rhonchi. No retractions. No resp. distress. No accessory muscle use. EXTR: No c/c/e NEURO Normal gait.  PSYCH: Normally interactive. Conversant. Not depressed or anxious appearing.  Calm demeanor.    Assessment and Plan:  1. Hair loss  TSH  2. Anemia  CBC with Differential   Unclear source of alopecia. Check for medical causes. Encourage prenatal vitamins with biotin. Trial of Rogaine  Results for orders placed in visit on 08/25/12  TSH      Component Value Range   TSH 0.64  0.35 - 5.50 uIU/mL  CBC WITH DIFFERENTIAL      Component Value Range   WBC 5.7  4.5 - 10.5 K/uL   RBC 4.04  3.87 - 5.11  Mil/uL   Hemoglobin 11.7 (*) 12.0 - 15.0 g/dL   HCT 16.1  09.6 - 04.5 %   MCV 89.3  78.0 - 100.0 fl   MCHC 32.4  30.0 - 36.0 g/dL   RDW 40.9  81.1 - 91.4 %   Platelets 199.0  150.0 - 400.0 K/uL   Neutrophils Relative 57.7  43.0 - 77.0 %   Lymphocytes Relative 33.3  12.0 - 46.0 %   Monocytes Relative 8.2  3.0 - 12.0 %   Eosinophils Relative 0.0  0.0 - 5.0 %   Basophils Relative 0.8  0.0 - 3.0 %   Neutro Abs 3.3  1.4 - 7.7 K/uL   Lymphs Abs 1.9  0.7 - 4.0 K/uL   Monocytes Absolute 0.5  0.1 - 1.0 K/uL   Eosinophils Absolute 0.0  0.0 - 0.7 K/uL   Basophils Absolute 0.0  0.0 - 0.1 K/uL     Orders Today:  Orders Placed This Encounter  Procedures  . TSH  . CBC with Differential    Medications Today: (Includes new updates added during medication reconciliation) No orders of the defined types were placed in this encounter.    Medications Discontinued: There are no discontinued medications.   Hannah Beat, MD,

## 2012-08-25 NOTE — Patient Instructions (Addendum)
Rogaine 5% twice a day (generic form)  Prenatal vitamin with BIOTIN

## 2012-08-26 ENCOUNTER — Encounter: Payer: Self-pay | Admitting: Internal Medicine

## 2014-05-01 ENCOUNTER — Ambulatory Visit (INDEPENDENT_AMBULATORY_CARE_PROVIDER_SITE_OTHER): Payer: 59 | Admitting: Family Medicine

## 2014-05-01 ENCOUNTER — Encounter: Payer: Self-pay | Admitting: Family Medicine

## 2014-05-01 VITALS — BP 142/94 | HR 82 | Temp 98.2°F | Ht 67.5 in | Wt 187.5 lb

## 2014-05-01 DIAGNOSIS — M545 Low back pain, unspecified: Secondary | ICD-10-CM | POA: Insufficient documentation

## 2014-05-01 MED ORDER — CYCLOBENZAPRINE HCL 10 MG PO TABS: 10.0000 mg | ORAL_TABLET | Freq: Every evening | ORAL | Status: DC | PRN
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
## 2014-05-01 NOTE — Patient Instructions (Addendum)
Take aleve with food 2 pills twice daily (unless it hurts your stomach)  Try flexeril before bed - it will sedate  Use heat on your back  Stop up front for a PT referral  Your blood pressure may be up due to pain and medication- follow up with your primary doctor in the next month or so to re check it
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
## 2014-05-01 NOTE — Progress Notes (Signed)
Pre visit review using our clinic review tool, if applicable. No additional management support is needed unless otherwise documented below in the visit note. 

## 2014-05-17 ENCOUNTER — Encounter: Payer: Self-pay | Admitting: Family Medicine

## 2014-05-17 ENCOUNTER — Ambulatory Visit (INDEPENDENT_AMBULATORY_CARE_PROVIDER_SITE_OTHER)
Admission: RE | Admit: 2014-05-17 | Discharge: 2014-05-17 | Disposition: A | Discharge status: Home / Self Care | Payer: 59 | Source: Ambulatory Visit | Attending: Family Medicine | Admitting: Family Medicine

## 2014-05-17 ENCOUNTER — Ambulatory Visit (INDEPENDENT_AMBULATORY_CARE_PROVIDER_SITE_OTHER): Payer: 59 | Admitting: Family Medicine

## 2014-05-17 VITALS — BP 130/86 | HR 92 | Temp 98.4°F | Wt 195.5 lb

## 2014-05-17 DIAGNOSIS — M549 Dorsalgia, unspecified: Secondary | ICD-10-CM | POA: Insufficient documentation

## 2014-05-17 DIAGNOSIS — M542 Cervicalgia: Secondary | ICD-10-CM

## 2014-05-17 MED ORDER — PREDNISONE 20 MG PO TABS: ORAL_TABLET | ORAL | Status: DC

## 2014-05-17 MED ORDER — HYDROCODONE-ACETAMINOPHEN 5-325 MG PO TABS: 1.0000 | ORAL_TABLET | Freq: Four times a day (QID) | ORAL | Status: DC | PRN
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
## 2014-05-17 NOTE — Patient Instructions (Signed)
We'll contact you with your xray report. Stop aleve, start prednisone.  Take it with food.  Take the hydrocodone before PT.  You can still take the flexeril at night if you aren't too drowsy.   Take care.  Plan on seeing Dr. Lorelei Pont in about 2 weeks unless you are completely better.

## 2015-11-05 ENCOUNTER — Emergency Department (HOSPITAL_COMMUNITY)
Admission: EM | Admit: 2015-11-05 | Discharge: 2015-11-05 | Disposition: A | Discharge status: Home / Self Care | Payer: No Typology Code available for payment source | Attending: Emergency Medicine | Admitting: Emergency Medicine

## 2015-11-05 ENCOUNTER — Encounter (HOSPITAL_COMMUNITY): Payer: Self-pay | Admitting: Emergency Medicine

## 2015-11-05 DIAGNOSIS — D508 Other iron deficiency anemias: Secondary | ICD-10-CM

## 2015-11-05 DIAGNOSIS — Y998 Other external cause status: Secondary | ICD-10-CM | POA: Diagnosis not present

## 2015-11-05 DIAGNOSIS — S3992XA Unspecified injury of lower back, initial encounter: Secondary | ICD-10-CM | POA: Diagnosis not present

## 2015-11-05 DIAGNOSIS — Y92410 Unspecified street and highway as the place of occurrence of the external cause: Secondary | ICD-10-CM | POA: Insufficient documentation

## 2015-11-05 DIAGNOSIS — G43909 Migraine, unspecified, not intractable, without status migrainosus: Secondary | ICD-10-CM | POA: Diagnosis not present

## 2015-11-05 DIAGNOSIS — S4992XA Unspecified injury of left shoulder and upper arm, initial encounter: Secondary | ICD-10-CM | POA: Diagnosis not present

## 2015-11-05 DIAGNOSIS — S199XXA Unspecified injury of neck, initial encounter: Secondary | ICD-10-CM | POA: Insufficient documentation

## 2015-11-05 DIAGNOSIS — Y9389 Activity, other specified: Secondary | ICD-10-CM | POA: Diagnosis not present

## 2015-11-05 DIAGNOSIS — M79602 Pain in left arm: Secondary | ICD-10-CM

## 2015-11-05 DIAGNOSIS — M545 Low back pain, unspecified: Secondary | ICD-10-CM

## 2015-11-05 DIAGNOSIS — D649 Anemia, unspecified: Secondary | ICD-10-CM | POA: Diagnosis not present

## 2015-11-05 MED ORDER — IBUPROFEN 800 MG PO TABS: 800.0000 mg | ORAL_TABLET | Freq: Three times a day (TID) | ORAL | Status: DC

## 2015-11-05 MED ORDER — CYCLOBENZAPRINE HCL 10 MG PO TABS: 10.0000 mg | ORAL_TABLET | Freq: Two times a day (BID) | ORAL | Status: DC | PRN

## 2015-11-05 NOTE — Discharge Instructions (Signed)
Take your medications as prescribed as needed for pain relief. You may also apply ice to affected areas for 15-20 minutes 3-4 times daily. Follow-up with your primary care provider in 3-4 days. Please return to the Emergency Department if symptoms worsen or new onset of fever, numbness, tingling, saddle anesthesia, loss of bowel or bladder, weakness.
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
## 2015-11-19 ENCOUNTER — Ambulatory Visit (INDEPENDENT_AMBULATORY_CARE_PROVIDER_SITE_OTHER): Payer: Self-pay | Admitting: Family Medicine

## 2015-11-19 ENCOUNTER — Ambulatory Visit (INDEPENDENT_AMBULATORY_CARE_PROVIDER_SITE_OTHER)
Admission: RE | Admit: 2015-11-19 | Discharge: 2015-11-19 | Disposition: A | Discharge status: Home / Self Care | Payer: 59 | Source: Ambulatory Visit | Attending: Family Medicine | Admitting: Family Medicine

## 2015-11-19 ENCOUNTER — Encounter: Payer: Self-pay | Admitting: Family Medicine

## 2015-11-19 VITALS — BP 150/102 | HR 107 | Temp 98.0°F | Ht 67.0 in | Wt 188.2 lb

## 2015-11-19 DIAGNOSIS — M542 Cervicalgia: Secondary | ICD-10-CM

## 2015-11-19 DIAGNOSIS — M545 Low back pain, unspecified: Secondary | ICD-10-CM

## 2015-11-19 DIAGNOSIS — S46812A Strain of other muscles, fascia and tendons at shoulder and upper arm level, left arm, initial encounter: Secondary | ICD-10-CM

## 2015-11-19 MED ORDER — PREDNISONE 20 MG PO TABS: ORAL_TABLET | ORAL | Status: DC

## 2015-11-19 MED ORDER — AMITRIPTYLINE HCL 25 MG PO TABS: 25.0000 mg | ORAL_TABLET | Freq: Every day | ORAL | Status: DC
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
## 2015-11-19 NOTE — Progress Notes (Signed)
Pre visit review using our clinic review tool, if applicable. No additional management support is needed unless otherwise documented below in the visit note. 

## 2015-12-19 ENCOUNTER — Encounter (INDEPENDENT_AMBULATORY_CARE_PROVIDER_SITE_OTHER): Payer: Self-pay

## 2015-12-19 ENCOUNTER — Ambulatory Visit (INDEPENDENT_AMBULATORY_CARE_PROVIDER_SITE_OTHER): Payer: Self-pay | Admitting: Family Medicine

## 2015-12-19 ENCOUNTER — Ambulatory Visit: Payer: 59 | Admitting: Family Medicine

## 2015-12-19 ENCOUNTER — Encounter: Payer: Self-pay | Admitting: Family Medicine

## 2015-12-19 VITALS — BP 150/100 | HR 100 | Temp 98.5°F | Ht 67.0 in | Wt 195.5 lb

## 2015-12-19 DIAGNOSIS — M542 Cervicalgia: Secondary | ICD-10-CM

## 2015-12-19 DIAGNOSIS — S46812A Strain of other muscles, fascia and tendons at shoulder and upper arm level, left arm, initial encounter: Secondary | ICD-10-CM

## 2015-12-19 DIAGNOSIS — S46812D Strain of other muscles, fascia and tendons at shoulder and upper arm level, left arm, subsequent encounter: Secondary | ICD-10-CM

## 2015-12-19 MED ORDER — AMITRIPTYLINE HCL 50 MG PO TABS: 50.0000 mg | ORAL_TABLET | Freq: Every day | ORAL | Status: DC

## 2015-12-19 NOTE — Progress Notes (Signed)
Pre visit review using our clinic review tool, if applicable. No additional management support is needed unless otherwise documented below in the visit note. 
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
## 2016-01-30 ENCOUNTER — Encounter: Payer: Self-pay | Admitting: Family Medicine

## 2016-01-30 ENCOUNTER — Ambulatory Visit (INDEPENDENT_AMBULATORY_CARE_PROVIDER_SITE_OTHER)
Admission: RE | Admit: 2016-01-30 | Discharge: 2016-01-30 | Disposition: A | Discharge status: Home / Self Care | Payer: Self-pay | Source: Ambulatory Visit | Attending: Family Medicine | Admitting: Family Medicine

## 2016-01-30 ENCOUNTER — Ambulatory Visit (INDEPENDENT_AMBULATORY_CARE_PROVIDER_SITE_OTHER): Payer: Self-pay | Admitting: Family Medicine

## 2016-01-30 VITALS — BP 154/106 | HR 106 | Temp 98.4°F | Ht 67.0 in | Wt 198.2 lb

## 2016-01-30 DIAGNOSIS — M545 Low back pain: Secondary | ICD-10-CM

## 2016-01-30 DIAGNOSIS — M542 Cervicalgia: Secondary | ICD-10-CM

## 2016-01-30 DIAGNOSIS — I10 Essential (primary) hypertension: Secondary | ICD-10-CM

## 2016-01-30 MED ORDER — HYDROCHLOROTHIAZIDE 12.5 MG PO TABS
12.5000 mg | ORAL_TABLET | Freq: Every day | ORAL | Status: DC
Start: 2016-01-30 — End: 2018-11-03

## 2016-01-30 MED ORDER — PREDNISONE 20 MG PO TABS: ORAL_TABLET | ORAL | Status: DC

## 2016-01-30 MED ORDER — SUMATRIPTAN SUCCINATE 50 MG PO TABS: 50.0000 mg | ORAL_TABLET | ORAL | Status: DC | PRN

## 2016-01-30 NOTE — Progress Notes (Signed)
Pre visit review using our clinic review tool, if applicable. No additional management support is needed unless otherwise documented below in the visit note. 

## 2016-01-30 NOTE — Patient Instructions (Signed)
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
## 2016-01-31 ENCOUNTER — Telehealth: Payer: Self-pay | Admitting: Family Medicine

## 2016-01-31 NOTE — Telephone Encounter (Signed)
Patient returned Donna's call. °
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
## 2016-02-19 ENCOUNTER — Ambulatory Visit: Payer: Self-pay | Attending: Family Medicine | Admitting: Physical Therapy

## 2016-02-19 DIAGNOSIS — M542 Cervicalgia: Secondary | ICD-10-CM

## 2016-02-19 DIAGNOSIS — M436 Torticollis: Secondary | ICD-10-CM

## 2016-02-19 DIAGNOSIS — M545 Low back pain, unspecified: Secondary | ICD-10-CM

## 2016-02-19 DIAGNOSIS — M6281 Muscle weakness (generalized): Secondary | ICD-10-CM

## 2016-02-19 NOTE — Patient Instructions (Signed)
   [facility] [address], [address] [place],  [address] Phone # [phone] Fax [phone]  
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
## 2016-02-22 ENCOUNTER — Ambulatory Visit: Payer: Self-pay | Attending: Family Medicine | Admitting: Physical Therapy

## 2016-02-22 DIAGNOSIS — M545 Low back pain, unspecified: Secondary | ICD-10-CM

## 2016-02-22 DIAGNOSIS — M542 Cervicalgia: Secondary | ICD-10-CM | POA: Insufficient documentation

## 2016-02-22 DIAGNOSIS — M436 Torticollis: Secondary | ICD-10-CM | POA: Insufficient documentation

## 2016-02-22 DIAGNOSIS — M6281 Muscle weakness (generalized): Secondary | ICD-10-CM | POA: Insufficient documentation

## 2016-02-22 NOTE — Patient Instructions (Addendum)
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
## 2016-02-26 ENCOUNTER — Ambulatory Visit: Payer: Self-pay

## 2016-02-26 DIAGNOSIS — M6281 Muscle weakness (generalized): Secondary | ICD-10-CM

## 2016-02-26 DIAGNOSIS — M436 Torticollis: Secondary | ICD-10-CM

## 2016-02-26 DIAGNOSIS — M545 Low back pain, unspecified: Secondary | ICD-10-CM

## 2016-02-26 DIAGNOSIS — M542 Cervicalgia: Secondary | ICD-10-CM
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
## 2016-02-26 NOTE — Patient Instructions (Signed)
Cervico-Thoracic: Extension / Rotation (Sitting)    Reach across body with left arm and grasp back of chair. Gently look over right side shoulder. Hold __20__ seconds. Relax. Repeat __3__ times per set. Do __1__ sets per session. Do __3__ sessions per day.  [email or web address] [address] [place], [place] [address] Phone # [phone] Fax [phone]

## 2016-02-28 ENCOUNTER — Ambulatory Visit: Payer: Self-pay | Admitting: Physical Therapy

## 2016-02-28 DIAGNOSIS — M436 Torticollis: Secondary | ICD-10-CM

## 2016-02-28 DIAGNOSIS — M545 Low back pain, unspecified: Secondary | ICD-10-CM

## 2016-02-28 DIAGNOSIS — M6281 Muscle weakness (generalized): Secondary | ICD-10-CM

## 2016-02-28 DIAGNOSIS — M542 Cervicalgia: Secondary | ICD-10-CM
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
## 2016-02-29 ENCOUNTER — Encounter: Payer: Self-pay | Admitting: Physical Therapy

## 2016-03-04 ENCOUNTER — Ambulatory Visit: Payer: Self-pay

## 2016-03-04 DIAGNOSIS — M545 Low back pain, unspecified: Secondary | ICD-10-CM

## 2016-03-04 DIAGNOSIS — M6281 Muscle weakness (generalized): Secondary | ICD-10-CM

## 2016-03-04 DIAGNOSIS — M542 Cervicalgia: Secondary | ICD-10-CM

## 2016-03-04 DIAGNOSIS — M436 Torticollis: Secondary | ICD-10-CM
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
## 2016-03-07 ENCOUNTER — Encounter: Payer: Self-pay | Admitting: Physical Therapy

## 2016-03-07 ENCOUNTER — Ambulatory Visit: Payer: Self-pay | Admitting: Physical Therapy

## 2016-03-07 DIAGNOSIS — M6281 Muscle weakness (generalized): Secondary | ICD-10-CM

## 2016-03-07 DIAGNOSIS — M545 Low back pain, unspecified: Secondary | ICD-10-CM

## 2016-03-07 DIAGNOSIS — M542 Cervicalgia: Secondary | ICD-10-CM

## 2016-03-07 DIAGNOSIS — M436 Torticollis: Secondary | ICD-10-CM
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
## 2016-03-07 NOTE — Patient Instructions (Signed)
  PNF Strengthening: Resisted    Laying on your back or standing with resistive band around each hand, bring right arm up and away, thumb back. Repeat _10___ times per set. Do _2___ sets per session. Do _1_ sessions per day.      Resisted Horizontal Abduction: Bilateral  Lay on back or stand, tubing in both hands, arms out in front. Keeping arms straight, pinch shoulder blades together and stretch arms out. Repeat _10___ times per set. Do 2____ sets per session. Do _1-2___ sessions per day.                  Scapular Retraction: Elbow Flexion (Standing)   With elbows bent to 90, pinch shoulder blades together and rotate arms out, keeping elbows bent. Repeat _10___ times per set. Do _1___ sets per session. Do many____ sessions per day.    Strengthening: Resisted Extension   Hold tubing in right hand, arm forward. Pull arm back, elbow straight. Repeat _10___ times per set. Do _2___ sets per session. Do _1-2___ sessions per day.  Can place band around the front of your body and perform with both arms at the same time.

## 2016-03-10 ENCOUNTER — Ambulatory Visit (INDEPENDENT_AMBULATORY_CARE_PROVIDER_SITE_OTHER): Payer: Self-pay | Admitting: Family Medicine

## 2016-03-10 ENCOUNTER — Encounter: Payer: Self-pay | Admitting: Family Medicine

## 2016-03-10 VITALS — BP 124/90 | HR 96 | Temp 98.3°F | Ht 67.0 in | Wt 199.2 lb

## 2016-03-10 DIAGNOSIS — I10 Essential (primary) hypertension: Secondary | ICD-10-CM

## 2016-03-10 DIAGNOSIS — M542 Cervicalgia: Secondary | ICD-10-CM

## 2016-03-10 DIAGNOSIS — M545 Low back pain: Secondary | ICD-10-CM
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
## 2016-03-10 NOTE — Progress Notes (Signed)
Pre visit review using our clinic review tool, if applicable. No additional management support is needed unless otherwise documented below in the visit note. 

## 2016-03-11 ENCOUNTER — Ambulatory Visit: Payer: Self-pay | Admitting: Physical Therapy

## 2016-03-11 DIAGNOSIS — M542 Cervicalgia: Secondary | ICD-10-CM

## 2016-03-11 DIAGNOSIS — M436 Torticollis: Secondary | ICD-10-CM

## 2016-03-11 DIAGNOSIS — M545 Low back pain, unspecified: Secondary | ICD-10-CM

## 2016-03-11 DIAGNOSIS — M6281 Muscle weakness (generalized): Secondary | ICD-10-CM
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
## 2016-03-14 ENCOUNTER — Ambulatory Visit: Payer: Self-pay | Admitting: Physical Therapy

## 2016-03-14 DIAGNOSIS — M6281 Muscle weakness (generalized): Secondary | ICD-10-CM

## 2016-03-14 DIAGNOSIS — M545 Low back pain, unspecified: Secondary | ICD-10-CM

## 2016-03-14 DIAGNOSIS — M542 Cervicalgia: Secondary | ICD-10-CM

## 2016-03-14 DIAGNOSIS — M436 Torticollis: Secondary | ICD-10-CM
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
## 2016-03-18 ENCOUNTER — Ambulatory Visit: Payer: Self-pay | Admitting: Physical Therapy

## 2016-03-18 DIAGNOSIS — M6281 Muscle weakness (generalized): Secondary | ICD-10-CM

## 2016-03-18 DIAGNOSIS — M542 Cervicalgia: Secondary | ICD-10-CM

## 2016-03-18 DIAGNOSIS — M436 Torticollis: Secondary | ICD-10-CM

## 2016-03-18 DIAGNOSIS — M545 Low back pain, unspecified: Secondary | ICD-10-CM
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
## 2016-03-21 ENCOUNTER — Ambulatory Visit: Payer: Self-pay | Admitting: Physical Therapy

## 2016-03-21 DIAGNOSIS — M545 Low back pain, unspecified: Secondary | ICD-10-CM

## 2016-03-21 DIAGNOSIS — M6281 Muscle weakness (generalized): Secondary | ICD-10-CM

## 2016-03-21 DIAGNOSIS — M436 Torticollis: Secondary | ICD-10-CM

## 2016-03-21 DIAGNOSIS — M542 Cervicalgia: Secondary | ICD-10-CM
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
## 2016-03-21 NOTE — Patient Instructions (Signed)
Pamela [name] PT [facility] [address], [address] [place], [place] [address] Phone # [phone] Fax [phone]    

## 2016-03-27 ENCOUNTER — Encounter: Payer: Self-pay | Admitting: Physical Therapy

## 2016-04-11 ENCOUNTER — Ambulatory Visit: Payer: Self-pay | Attending: Family Medicine | Admitting: Physical Therapy

## 2016-04-11 ENCOUNTER — Telehealth: Payer: Self-pay | Admitting: Physical Therapy

## 2016-04-11 NOTE — Telephone Encounter (Signed)
Patient no-showed last scheduled appt.  Called and left voicemail for patient to call back.

## 2016-08-06 ENCOUNTER — Telehealth: Payer: Self-pay | Admitting: Family Medicine

## 2016-08-06 NOTE — Telephone Encounter (Signed)
Routed.
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
## 2016-08-06 NOTE — Telephone Encounter (Signed)
Routed to wrong provider.  Will route to Dr Lorelei Pont

## 2016-08-07 ENCOUNTER — Encounter: Payer: Self-pay | Admitting: Family Medicine

## 2016-08-07 NOTE — Telephone Encounter (Signed)
Pamela [name] notified that her letter is ready to be picked up at the front desk.

## 2016-08-07 NOTE — Telephone Encounter (Signed)
This was done - please let her know.

## 2016-10-31 ENCOUNTER — Encounter (HOSPITAL_COMMUNITY): Payer: Self-pay | Admitting: Emergency Medicine

## 2016-10-31 ENCOUNTER — Ambulatory Visit (INDEPENDENT_AMBULATORY_CARE_PROVIDER_SITE_OTHER): Payer: Self-pay

## 2016-10-31 ENCOUNTER — Ambulatory Visit (HOSPITAL_COMMUNITY)
Admission: EM | Admit: 2016-10-31 | Discharge: 2016-10-31 | Disposition: A | Discharge status: Home / Self Care | Payer: Self-pay | Attending: Emergency Medicine | Admitting: Emergency Medicine

## 2016-10-31 DIAGNOSIS — IMO0001 Reserved for inherently not codable concepts without codable children: Secondary | ICD-10-CM

## 2016-10-31 DIAGNOSIS — S93401A Sprain of unspecified ligament of right ankle, initial encounter: Secondary | ICD-10-CM

## 2016-10-31 MED ORDER — HYDROCODONE-ACETAMINOPHEN 5-325 MG PO TABS: 1.0000 | ORAL_TABLET | Freq: Four times a day (QID) | ORAL | 0 refills | Status: DC | PRN

## 2016-10-31 NOTE — ED Triage Notes (Signed)
Pt reports she inverted right foot last night while going down the patio.   Sx today include: swelling, pain ... Pain will increase w/pressure  Crutches present upon arrival.   A&O x4... NAD

## 2016-10-31 NOTE — ED Provider Notes (Signed)
CSN: [id number]     Arrival date & time 10/31/16  1003 History   None    Chief Complaint  Patient presents with  . Foot Injury     Foot Injury  Pt reports she was coming down steps last night and came down on her right foot wrong causing the foot to "roll" inwards. Has had pain and swelling since event and has been unable to bear weight. Concerned regarding possible fx.   Past Medical History:  Diagnosis Date  . Anemia   . Migraine   . Other specified iron deficiency anemias    Past Surgical History:  Procedure Laterality Date  . ABDOMINOPLASTY  2005  . Breast lift  2005  . CESAREAN SECTION  [id number]  . svd     x 2  . TUBAL LIGATION  2005  . WISDOM TOOTH EXTRACTION     Family History  Problem Relation Age of Onset  . Arthritis      family history  . Prostate cancer Father   . Diabetes      1st degree relative  . Hypertension      family history   Social History  Substance Use Topics  . Smoking status: Never Smoker  . Smokeless tobacco: Never Used  . Alcohol use No   OB History    No data available     Review of Systems  All other systems reviewed and are negative.   Allergies  Sulfa antibiotics  Home Medications   Prior to Admission medications   Medication Sig Start Date End Date Taking? Authorizing Provider  amitriptyline (ELAVIL) 50 MG tablet Take 1 tablet (50 mg total) by mouth at bedtime. 12/19/15   Owens Loffler, MD  cyclobenzaprine (FLEXERIL) 10 MG tablet Take 1 tablet (10 mg total) by mouth 2 (two) times daily as needed for muscle spasms. 11/05/15   Nona Dell, PA-C  Ferrous Gluconate 325 (36 FE) MG TABS Take 1 tablet by mouth 2 (two) times daily. 02/13/12   Owens Loffler, MD  hydrochlorothiazide (HYDRODIURIL) 12.5 MG tablet Take 1 tablet (12.5 mg total) by mouth daily. 01/30/16   Owens Loffler, MD  HYDROcodone-acetaminophen (NORCO/VICODIN) 5-325 MG tablet Take 1-2 tablets by mouth every 6 (six) hours as needed for moderate pain  or severe pain. 10/31/16   Rhetta Mura Schorr, NP  ibuprofen (ADVIL,MOTRIN) 800 MG tablet Take 1 tablet (800 mg total) by mouth 3 (three) times daily. 11/05/15   Nona Dell, PA-C  predniSONE (DELTASONE) 20 MG tablet 2 tabs po for 5 days, then 1 tab po for 5 days 01/30/16   Owens Loffler, MD  SUMAtriptan (IMITREX) 50 MG tablet Take 1 tablet (50 mg total) by mouth every 2 (two) hours as needed. (May repeat once) 01/30/16   Owens Loffler, MD   Meds Ordered and Administered this Visit  Medications - No data to display  BP (!) 159/102 (BP Location: Left Arm)   Pulse 82   Temp 97.4 F (36.3 C) (Oral)   Resp 18   LMP 03/09/2012 (LMP Unknown)   SpO2 100%  No data found.   Physical Exam  Constitutional: She is oriented to person, place, and time. She appears well-developed and well-nourished.  HENT:  Head: Normocephalic and atraumatic.  Eyes: Conjunctivae are normal.  Cardiovascular: Normal rate.   Pulmonary/Chest: Effort normal.  Musculoskeletal:       Feet:  Moderate amount of edema and erythema to (r) lateral malleolus. No obvious deformity or open wound.  Neurological:  She is alert and oriented to person, place, and time.  Skin: Skin is warm and dry.  Psychiatric: She has a normal mood and affect.    Urgent Care Course   Clinical Course     Procedures (including critical care time)  Labs Review Labs Reviewed - No data to display  Imaging Review Dg Ankle Complete Right  Result Date: 10/31/2016 CLINICAL DATA:  Right ankle pain status post sprain. EXAM: RIGHT ANKLE - COMPLETE 3+ VIEW COMPARISON:  None. FINDINGS: There is no evidence of fracture, dislocation, or joint effusion. There is no evidence of arthropathy or other focal bone abnormality. Soft tissues are unremarkable. IMPRESSION: No acute osseous injury of the right ankle. Electronically Signed   By: Kathreen Devoid   On: 10/31/2016 10:57   Dg Foot Complete Right  Result Date: 10/31/2016 CLINICAL DATA:   Pain following rolling type injury EXAM: RIGHT FOOT COMPLETE - 3+ VIEW COMPARISON:  None. FINDINGS: Frontal, oblique, and lateral views were obtained. There is no acute fracture or dislocation. There is mild joint space narrowing in the first MTP joint with mild bunion formation. Other joint spaces appear normal. No erosive change. 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 Other joint spaces appear unremarkable. No fracture or dislocation. Electronically Signed   By: Lowella Grip III M.D.   On: 10/31/2016 11:03     Visual Acuity Review  Right Eye Distance:   Left Eye Distance:   Bilateral Distance:    Right Eye Near:   Left Eye Near:    Bilateral Near:         MDM   1. First degree ankle sprain, right, initial encounter   Imaging negative for fx. ASO applied. Short course of Norco. Pt has her own crutches. Weight bearing as tolerated. Ortho f/u, referral provided if symptoms do not gradually improve. Home care discussed and provided in print.    Jeryl Columbia, NP 10/31/16 1135

## 2017-07-16 ENCOUNTER — Other Ambulatory Visit: Payer: Self-pay | Admitting: Family Medicine

## 2018-11-03 ENCOUNTER — Other Ambulatory Visit: Payer: Self-pay

## 2018-11-03 ENCOUNTER — Encounter (HOSPITAL_COMMUNITY): Payer: Self-pay | Admitting: Emergency Medicine

## 2018-11-03 ENCOUNTER — Ambulatory Visit (HOSPITAL_COMMUNITY)
Admission: EM | Admit: 2018-11-03 | Discharge: 2018-11-03 | Disposition: A | Discharge status: Home / Self Care | Payer: Self-pay | Attending: Family Medicine | Admitting: Family Medicine

## 2018-11-03 DIAGNOSIS — S39012A Strain of muscle, fascia and tendon of lower back, initial encounter: Secondary | ICD-10-CM

## 2018-11-03 DIAGNOSIS — M62830 Muscle spasm of back: Secondary | ICD-10-CM

## 2018-11-03 MED ORDER — CYCLOBENZAPRINE HCL 10 MG PO TABS: 10.0000 mg | ORAL_TABLET | Freq: Two times a day (BID) | ORAL | 0 refills | Status: DC | PRN

## 2018-11-03 MED ORDER — MELOXICAM 7.5 MG PO TABS: 7.5000 mg | ORAL_TABLET | Freq: Every day | ORAL | 0 refills | Status: DC

## 2018-11-03 MED ORDER — KETOROLAC TROMETHAMINE 60 MG/2ML IM SOLN
INTRAMUSCULAR | Status: AC
  Filled 2018-11-03: qty 2

## 2018-11-03 MED ORDER — KETOROLAC TROMETHAMINE 60 MG/2ML IM SOLN
60.0000 mg | Freq: Once | INTRAMUSCULAR | Status: AC
  Administered 2018-11-03: 60 mg via INTRAMUSCULAR

## 2018-11-03 NOTE — Discharge Instructions (Addendum)
We gave you a Toradol injection in clinic for pain inflammation Flexeril for muscle relaxant and meloxicam for pain inflammation sent to the pharmacy Heat, gentle stretching and massage could help. Follow up as needed for continued or worsening symptoms

## 2018-11-03 NOTE — ED Triage Notes (Signed)
Pt complains of lower right to central back spasms that began Monday evening.  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  She does not recall any injury to the back at that time.  She has been taking Tramadol and a muscle relaxer with only minimal pain decrease.
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
## 2018-11-13 ENCOUNTER — Ambulatory Visit (HOSPITAL_COMMUNITY)
Admission: EM | Admit: 2018-11-13 | Discharge: 2018-11-13 | Disposition: A | Discharge status: Home / Self Care | Payer: Self-pay | Attending: Family Medicine | Admitting: Family Medicine

## 2018-11-13 ENCOUNTER — Encounter (HOSPITAL_COMMUNITY): Payer: Self-pay | Admitting: Emergency Medicine

## 2018-11-13 DIAGNOSIS — M545 Low back pain, unspecified: Secondary | ICD-10-CM

## 2018-11-13 DIAGNOSIS — M62830 Muscle spasm of back: Secondary | ICD-10-CM

## 2018-11-13 LAB — POCT URINALYSIS DIP (DEVICE)
BILIRUBIN URINE: NEGATIVE
Glucose, UA: NEGATIVE mg/dL
Leukocytes, UA: NEGATIVE
Nitrite: NEGATIVE
PH: 6 (ref 5.0–8.0)
Protein, ur: 30 mg/dL — AB
Specific Gravity, Urine: >=1.03 (ref 1.005–1.030)
Urobilinogen, UA: 0.2 mg/dL (ref 0.0–1.0)

## 2018-11-13 MED ORDER — BACLOFEN 10 MG PO TABS: 10.0000 mg | ORAL_TABLET | Freq: Three times a day (TID) | ORAL | 0 refills | Status: DC

## 2018-11-13 MED ORDER — KETOROLAC TROMETHAMINE 30 MG/ML IJ SOLN
30.0000 mg | Freq: Once | INTRAMUSCULAR | Status: AC
  Administered 2018-11-13: 30 mg via INTRAMUSCULAR

## 2018-11-13 MED ORDER — PREDNISONE 50 MG PO TABS: 50.0000 mg | ORAL_TABLET | Freq: Every day | ORAL | 0 refills | Status: DC

## 2018-11-13 MED ORDER — KETOROLAC TROMETHAMINE 30 MG/ML IJ SOLN
INTRAMUSCULAR | Status: AC
  Filled 2018-11-13: qty 1

## 2018-11-13 NOTE — ED Triage Notes (Signed)
Pt here with left sided back pain into left leg not improved from prior visit
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
## 2018-11-13 NOTE — Discharge Instructions (Signed)
Toradol injection in office today. Start prednisone as directed. Baclofen as directed as muscle relaxant. Stop using flexeril. Warm compresses. Follow up with PCP for further evaluation if symptoms still not improving. If experiencing numbness/tingling to the inner thighs, loss of bladder or bowel control, go to the emergency department for further evaluation needed.

## 2018-11-14 ENCOUNTER — Emergency Department (HOSPITAL_COMMUNITY)
Admission: EM | Admit: 2018-11-14 | Discharge: 2018-11-14 | Disposition: A | Discharge status: Home / Self Care | Payer: Self-pay | Attending: Emergency Medicine | Admitting: Emergency Medicine

## 2018-11-14 ENCOUNTER — Encounter (HOSPITAL_COMMUNITY): Payer: Self-pay

## 2018-11-14 DIAGNOSIS — M5432 Sciatica, left side: Secondary | ICD-10-CM | POA: Insufficient documentation

## 2018-11-14 DIAGNOSIS — I10 Essential (primary) hypertension: Secondary | ICD-10-CM | POA: Insufficient documentation

## 2018-11-14 DIAGNOSIS — M62830 Muscle spasm of back: Secondary | ICD-10-CM | POA: Insufficient documentation

## 2018-11-14 LAB — I-STAT CHEM 8, ED
BUN: 16 mg/dL (ref 6–20)
Calcium, Ion: 1.19 mmol/L (ref 1.15–1.40)
Chloride: 105 mmol/L (ref 98–111)
Creatinine, Ser: 1 mg/dL (ref 0.44–1.00)
Glucose, Bld: 121 mg/dL — ABNORMAL HIGH (ref 70–99)
HEMATOCRIT: 41 % (ref 36.0–46.0)
HEMOGLOBIN: 13.9 g/dL (ref 12.0–15.0)
Potassium: 4.1 mmol/L (ref 3.5–5.1)
SODIUM: 138 mmol/L (ref 135–145)
TCO2: 26 mmol/L (ref 22–32)

## 2018-11-14 LAB — URINALYSIS, ROUTINE W REFLEX MICROSCOPIC
BILIRUBIN URINE: NEGATIVE
Glucose, UA: NEGATIVE mg/dL
Ketones, ur: NEGATIVE mg/dL
Leukocytes, UA: NEGATIVE
NITRITE: NEGATIVE
PH: 5 (ref 5.0–8.0)
Protein, ur: NEGATIVE mg/dL
Specific Gravity, Urine: 1.027 (ref 1.005–1.030)

## 2018-11-14 LAB — CBC WITH DIFFERENTIAL/PLATELET
Abs Immature Granulocytes: 0.02 10*3/uL (ref 0.00–0.07)
BASOS ABS: 0 10*3/uL (ref 0.0–0.1)
BASOS PCT: 0 %
Eosinophils Absolute: 0 10*3/uL (ref 0.0–0.5)
Eosinophils Relative: 0 %
HCT: 41.4 % (ref 36.0–46.0)
HEMOGLOBIN: 12.7 g/dL (ref 12.0–15.0)
Immature Granulocytes: 0 %
LYMPHS ABS: 0.9 10*3/uL (ref 0.7–4.0)
Lymphocytes Relative: 10 %
MCH: 27.6 pg (ref 26.0–34.0)
MCHC: 30.7 g/dL (ref 30.0–36.0)
MCV: 90 fL (ref 80.0–100.0)
Monocytes Absolute: 0.1 10*3/uL (ref 0.1–1.0)
Monocytes Relative: 1 %
NEUTROS PCT: 89 %
NRBC: 0 % (ref 0.0–0.2)
Neutro Abs: 7.6 10*3/uL (ref 1.7–7.7)
Platelets: 336 10*3/uL (ref 150–400)
RBC: 4.6 MIL/uL (ref 3.87–5.11)
RDW: 12.8 % (ref 11.5–15.5)
WBC: 8.5 10*3/uL (ref 4.0–10.5)

## 2018-11-14 MED ORDER — LIDOCAINE 5 % EX PTCH: 1.0000 | MEDICATED_PATCH | CUTANEOUS | 0 refills | Status: DC

## 2018-11-14 MED ORDER — LIDOCAINE 5 % EX PTCH
1.0000 | MEDICATED_PATCH | CUTANEOUS | Status: DC
  Administered 2018-11-14: 1 via TRANSDERMAL
  Filled 2018-11-14: qty 1

## 2018-11-14 MED ORDER — SODIUM CHLORIDE 0.9 % IV BOLUS
1000.0000 mL | Freq: Once | INTRAVENOUS | Status: AC
  Administered 2018-11-14: 1000 mL via INTRAVENOUS

## 2018-11-14 MED ORDER — HYDROMORPHONE HCL 1 MG/ML IJ SOLN
1.0000 mg | Freq: Once | INTRAMUSCULAR | Status: AC
  Administered 2018-11-14: 1 mg via INTRAVENOUS
  Filled 2018-11-14: qty 1

## 2018-11-14 NOTE — ED Notes (Signed)
Pt was seen at urgent care yesterday, reports all they did was urinalysis, added prednisone and stronger muscle relaxer but the medications are not working.

## 2018-11-14 NOTE — ED Notes (Signed)
Pt stable, ambulatory, states understanding of discharge instructions 

## 2018-11-14 NOTE — ED Triage Notes (Signed)
Pt states low back pain since the 11th of November, has been seen at urgent care twice for this.
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
## 2018-11-23 ENCOUNTER — Encounter: Payer: Self-pay | Admitting: Primary Care

## 2018-11-23 ENCOUNTER — Ambulatory Visit: Payer: Self-pay | Admitting: Primary Care

## 2018-11-23 VITALS — BP 180/120 | HR 104 | Temp 98.4°F | Ht 67.0 in | Wt 196.5 lb

## 2018-11-23 DIAGNOSIS — M5442 Lumbago with sciatica, left side: Secondary | ICD-10-CM

## 2018-11-23 NOTE — Progress Notes (Signed)
Subjective:    Patient ID: Pamela [name], female    DOB: 05-16-1971, 47 y.o.   MRN: [id number]  HPI  Pamela [name] is a 47 year old female, patient of Dr. Lillie Fragmin, with a history of chronic lower back pain who presents today for emergency department follow up.  She was originally evaluated on 11/03/18 at [facility] with a two day history of right lower back pain. 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 She had taken Flexeril and Tramadol without improvement. During that visit her symptoms were suspected to be secondary to muscle spasm/lumbar strain. She was treated with IM Toradol and prescribed Flexeril and Meloxicam.  She denied urinary symptoms, loss of bowel/bladder control.  She presented to [facility] on 11/13/18 with a chief complaint of back pain. She had been taking Meloxicam and Flexeril without much improvement. She had been taking Tramadol with some improvement. Her pain moved from the right lower back to left lower back with radiation of pain down the left lower extremity. During this visit she was provided with another injection of Toradol and provided prescriptions for Baclofen and prednisone.  She denied saddle anesthesia, loss of bowel/bladder control, heavy lifting.  She presented to [facility] on [facility]/14/18 with a chief complaint of lower back pain. The medication provided the day prior hadn't provided any improvement. She denied loss of bowel/bladder, saddle anesthesia. She was treated with IV fluids for hypertension and Dilaudid for pain. She was discharged home with lidocaine patches 5%.   Since her Urgent Care visits and [facility] visit she's had some improvement in her symptoms. She started driving yesterday. She hasn't had to take any pain medication or other medication yesterday or today. She notes some loss of bladder control that began 4-5 days ago, this is new.  Today she endorses that she has trouble walking with her left lower extremity including pain with sitting, and inability to lay onto her left side. Most of her pain is to the left buttocks, numbness to the left lower extremity below the knee, also to the first three toes.   Overall all symptoms have improved slightly but persists daily.  Symptoms have been present since November 11th. She's not had a flu shot this year. She denies saddle anesthesia, recent trauma, urinary symptoms.  Review of Systems  Constitutional: Negative for fever.  Genitourinary: Negative for dysuria and frequency.       Some loss of bladder control.  Musculoskeletal: Positive for back pain.  Neurological: Positive for weakness and numbness.       Past Medical History:  Diagnosis Date  . Anemia   . Migraine   . Other specified iron deficiency anemias      Social History   Socioeconomic History  . Marital status: Married    Spouse name: Not on file  . Number of children: 5  . Years of education: Not on file  . Highest education level: Not on file  Occupational History  . Occupation: Own business  Social Needs  . Financial resource strain: Not on file  . Food insecurity:    Worry: Not on file    Inability: Not on file  . Transportation needs:    Medical: Not on file    Non-medical: Not on file  Tobacco Use  . Smoking status: Never Smoker  . Smokeless tobacco: Never Used  Substance and Sexual Activity  . Alcohol use: No  . Drug use: No  . Sexual activity: Yes    Birth control/protection: Surgical  Lifestyle  . Physical activity:    Days per week: Not on file    Minutes per session: Not on file  . Stress: Not on file  Relationships  . Social connections:    Talks on phone: Not on file    Gets together: Not on file    Attends religious service: Not on file    Active member of club or organization: Not on file    Attends meetings of clubs or organizations: Not on file    Relationship status:  Not on file  . Intimate partner violence:    Fear of current or ex partner: Not on file    Emotionally abused: Not on file    Physically abused: Not on file    Forced sexual activity: Not on file  Other Topics Concern  . Not on file  Social History Narrative   Children: 9,7,5,4x2 (5 total). 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 No regular exercise   Never smoker, no drugs or alcohol, denies h/o IVDU. Works in an office.     Past Surgical History:  Procedure Laterality Date  . ABDOMINOPLASTY  2005  . Breast lift  2005  . CESAREAN SECTION  [id number]  . svd     x 2  . TUBAL LIGATION  2005  . WISDOM TOOTH EXTRACTION      Family History  Problem Relation Age of Onset  . Arthritis Unknown        family history  . Prostate cancer Father   . Diabetes Unknown        1st degree relative  . Hypertension Unknown        family history    Allergies  Allergen Reactions  . Sulfa Antibiotics Itching    shaky    Current Outpatient Medications on File Prior to Visit  Medication Sig Dispense Refill  . baclofen (LIORESAL) 10 MG tablet Take 1 tablet (10 mg total) by mouth 3 (three) times daily. 30 each 0  . lidocaine (LIDODERM) 5 % Place 1 patch onto the skin daily. Remove & Discard patch within 12 hours or as directed by MD 30 patch 0  . traMADol (ULTRAM) 50 MG tablet Take by mouth every 6 (six) hours as needed.     No current facility-administered medications on file prior to visit.     BP (!) 180/120   Pulse (!) 104   Temp 98.4 F (36.9 C) (Oral)   Ht 5\' 7"  (1.702 m)   Wt 196 lb 8 oz (89.1 kg)   LMP 03/09/2012 (LMP Unknown)   SpO2 99%   BMI 30.78 kg/m    Objective:   Physical Exam  Constitutional: She is oriented to person, place, and time. She appears well-nourished.  Cardiovascular: Normal rate.  Respiratory: Effort normal.  Musculoskeletal:       Lumbar back: She exhibits decreased range of motion, tenderness and pain. She exhibits no bony tenderness.       Back:        Legs: Positive left straight leg raise.  Moderate limp, however, ambulatory.  Equal strength to bilateral lower extremities.  Appears uncomfortable during exam.  Neurological: She is alert and oriented to person, place, and time.  Skin: Skin is warm and dry.  Psychiatric: She has a normal mood and affect.  Assessment & Plan:

## 2018-11-23 NOTE — Patient Instructions (Signed)
Stop by the front desk and speak with either Rosaria Ferries or Anastasiya regarding your MRI.  Please go to the hospital if your symptoms persist, especially with loss of your bladder control.  It was a pleasure meeting you!

## 2018-11-23 NOTE — Assessment & Plan Note (Signed)
Acute low back pain since November 01, 2018. Treated 3 separate times without relief. HPI and exam today suspicious for spinal cord involvement, especially given recent loss of bladder control.   Stat MRI of lumbar spine ordered.  Stat referral placed orthopedics/neurosurgery.  Strict emergency department precautions provided.  She does appear stable today.

## 2018-11-24 ENCOUNTER — Telehealth: Payer: Self-pay

## 2018-11-24 NOTE — Telephone Encounter (Signed)
Mollie with [facility] calling about called report; Mollie said report was lengthy and would fax report to [phone]. Report came via fax and given to Gentry Fitz NP and [facility].

## 2018-11-24 NOTE — Telephone Encounter (Signed)
Report reviewed. Patient seeing orthopedics today.

## 2018-11-25 ENCOUNTER — Ambulatory Visit (INDEPENDENT_AMBULATORY_CARE_PROVIDER_SITE_OTHER): Payer: Self-pay

## 2018-11-25 ENCOUNTER — Ambulatory Visit (INDEPENDENT_AMBULATORY_CARE_PROVIDER_SITE_OTHER): Payer: Self-pay | Admitting: Specialist

## 2018-11-25 ENCOUNTER — Encounter (INDEPENDENT_AMBULATORY_CARE_PROVIDER_SITE_OTHER): Payer: Self-pay | Admitting: Specialist

## 2018-11-25 ENCOUNTER — Telehealth (INDEPENDENT_AMBULATORY_CARE_PROVIDER_SITE_OTHER): Payer: Self-pay | Admitting: Radiology

## 2018-11-25 VITALS — BP 179/110 | HR 95 | Ht 67.0 in | Wt 195.0 lb

## 2018-11-25 DIAGNOSIS — M545 Low back pain, unspecified: Secondary | ICD-10-CM

## 2018-11-25 DIAGNOSIS — M5116 Intervertebral disc disorders with radiculopathy, lumbar region: Secondary | ICD-10-CM

## 2018-11-25 MED ORDER — GABAPENTIN 300 MG PO CAPS: ORAL_CAPSULE | ORAL | 0 refills | Status: DC

## 2018-11-25 MED ORDER — HYDROCODONE-ACETAMINOPHEN 5-325 MG PO TABS: 1.0000 | ORAL_TABLET | Freq: Four times a day (QID) | ORAL | 0 refills | Status: DC | PRN

## 2018-11-25 MED ORDER — NAPROXEN 500 MG PO TABS: 500.0000 mg | ORAL_TABLET | Freq: Two times a day (BID) | ORAL | 1 refills | Status: DC

## 2018-11-25 NOTE — Patient Instructions (Signed)
Avoid frequent bending and stooping  No lifting greater than 10 lbs. May use ice or moist heat for pain. Weight loss is of benefit. Best medication for lumbar disc disease is arthritis medications like motrin, celebrex and naprosyn. Exercise is important to improve your indurance and does allow people to function better inspite of back pain.   Handicap license is approved. Dr. Romona Curls secretary/Assistant will call to arrange for epidural steroid injection
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
## 2018-11-25 NOTE — Telephone Encounter (Signed)
Patient left message returning call to schedule injection appointment.  Call back# [phone]

## 2018-11-25 NOTE — Telephone Encounter (Signed)
Pt scheduled with driver 13/88/71

## 2018-12-09 ENCOUNTER — Encounter (INDEPENDENT_AMBULATORY_CARE_PROVIDER_SITE_OTHER): Payer: Self-pay | Admitting: Specialist

## 2018-12-09 ENCOUNTER — Ambulatory Visit (INDEPENDENT_AMBULATORY_CARE_PROVIDER_SITE_OTHER): Payer: Self-pay | Admitting: Specialist

## 2018-12-09 VITALS — BP 174/112 | HR 97 | Ht 67.0 in | Wt 195.0 lb

## 2018-12-09 DIAGNOSIS — M5416 Radiculopathy, lumbar region: Secondary | ICD-10-CM

## 2018-12-09 NOTE — Patient Instructions (Signed)
Avoid frequent bending and stooping  No lifting greater than 10 lbs. May use ice or moist heat for pain. Weight loss is of benefit. Best medication for lumbar disc disease is arthritis medications like motrin, celebrex and naprosyn. Exercise is important to improve your indurance and does allow people to function better inspite of back pain.  Have ESI and return in one week.
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
## 2018-12-10 ENCOUNTER — Encounter (INDEPENDENT_AMBULATORY_CARE_PROVIDER_SITE_OTHER): Payer: Self-pay | Admitting: Physical Medicine and Rehabilitation

## 2018-12-10 ENCOUNTER — Ambulatory Visit (INDEPENDENT_AMBULATORY_CARE_PROVIDER_SITE_OTHER): Payer: Self-pay

## 2018-12-10 ENCOUNTER — Ambulatory Visit (INDEPENDENT_AMBULATORY_CARE_PROVIDER_SITE_OTHER): Payer: Self-pay | Admitting: Physical Medicine and Rehabilitation

## 2018-12-10 VITALS — BP 178/125 | HR 98 | Temp 98.6°F

## 2018-12-10 DIAGNOSIS — M5416 Radiculopathy, lumbar region: Secondary | ICD-10-CM

## 2018-12-10 MED ORDER — METHYLPREDNISOLONE ACETATE 80 MG/ML IJ SUSP
80.0000 mg | Freq: Once | INTRAMUSCULAR | Status: AC
  Administered 2018-12-10: 80 mg

## 2018-12-10 NOTE — Progress Notes (Signed)
 .  Numeric Pain Rating Scale and Functional Assessment Average Pain 8   In the last MONTH (on 0-10 scale) has pain interfered with the following?  1. General activity like being  able to carry out your everyday physical activities such as walking, climbing stairs, carrying groceries, or moving a chair?  Rating(6)   +Driver, -BT, -Dye Allergies.  

## 2018-12-10 NOTE — Patient Instructions (Signed)

## 2018-12-17 ENCOUNTER — Ambulatory Visit (INDEPENDENT_AMBULATORY_CARE_PROVIDER_SITE_OTHER): Payer: Self-pay | Admitting: Specialist

## 2019-01-07 NOTE — Progress Notes (Signed)
Pamela [name] - 48 y.o. female MRN [id number]  Date of birth: Aug 03, 1971  Office Visit Note: Visit Date: 12/10/2018 PCP: Owens Loffler, MD Referred by: Owens Loffler, MD  Subjective: Chief Complaint  Patient presents with  . Lower Back - Pain  . Left Leg - Pain   HPI:  Pamela [name] is a 48 y.o. female who comes in today At the request of Dr. Basil Dess for left L4-5 interlaminar epidural steroid injection.  Patient is having left radicular pain down to the foot.  Has stenosis which is moderate along with protrusion at L4-5.  ROS Otherwise per HPI.  Assessment & Plan: Visit Diagnoses:  1. Lumbar radiculopathy     Plan: No additional findings.   Meds & Orders:  Meds ordered this encounter  Medications  . methylPREDNISolone acetate (DEPO-MEDROL) injection 80 mg    Orders Placed This Encounter  Procedures  . XR C-ARM NO REPORT  . Epidural Steroid injection    Follow-up: Return for Basil Dess, MD.   Procedures: No procedures performed  Lumbar Epidural Steroid Injection - Interlaminar Approach with Fluoroscopic Guidance  Patient: Pamela [name]      Date of Birth: 04-05-1971 MRN: [id number] PCP: Owens Loffler, MD      Visit Date: 12/10/2018   Universal Protocol:     Consent Given By: the patient  Position: PRONE  Additional Comments: Vital signs were monitored before and after the procedure. Patient was prepped and draped in the usual sterile fashion. The correct patient, procedure, and site was verified.   Injection Procedure Details:  Procedure Site One Meds Administered:  Meds ordered this encounter  Medications  . methylPREDNISolone acetate (DEPO-MEDROL) injection 80 mg     Laterality: Left  Location/Site:  L4-L5  Needle size: 20 G  Needle type: Tuohy  Needle Placement: Paramedian epidural  Findings:   -Comments: Excellent flow of contrast into the epidural space.  Procedure Details: Using a paramedian approach from the side  mentioned above, the region overlying the inferior lamina was localized under fluoroscopic visualization and the soft tissues overlying this structure were infiltrated with 4 ml. of 1% Lidocaine without Epinephrine. The Tuohy needle was inserted into the epidural space using a paramedian approach.   The epidural space was localized using loss of resistance along with lateral and bi-planar fluoroscopic views.  After negative aspirate for air, blood, and CSF, a 2 ml. volume of Isovue-250 was injected into the epidural space and the flow of contrast was observed. Radiographs were obtained for documentation purposes.    The injectate was administered into the level noted above.   Additional Comments:  The patient tolerated the procedure well Dressing: Band-Aid    Post-procedure details: Patient was observed during the procedure. Post-procedure instructions were reviewed.  Patient left the clinic in stable condition.   Clinical History: Acute Interface, Incoming Rad Results - 11/24/2018  8:53 AM EST HISTORY:  Dorsalgia, unspecified  TECHNIQUE: MRI of the lumbar spine without contrast.  COMPARISON: None.  FINDINGS: No evidence of acute fracture or subluxation. Conus terminates in normal position. Partially imaged T2 hyperintense right renal lesion measuring 5.9 cm. Degenerative changes as follows: T12-L1: No significant stenosis L1-L2: Minimal disc bulge. No significant stenosis. L2-L3: No significant stenosis L3-L4: Minimal disc bulge. No significant stenosis. L4-L5: Disc bulge with small central protrusion. Facet degenerative changes and ligamentum flavum hypertrophy. There is moderate central canal stenosis. Mild bilateral foraminal stenosis. L5-S1: Small broad-based disc osteophyte complex and facet degenerative changes. Mild bilateral  foraminal stenosis.   IMPRESSION:  1. Disc bulge and small central disc protrusion at L4-L5. Facet degenerative changes and ligamentum flavum  hypertrophy at this level. There is associated moderate central canal and mild foraminal stenosis. 2. Mild bilateral foraminal stenosis at L5-S1. 3. Otherwise mild lumbar degenerative changes. Please see above comments for details. 4. Right renal lesion measuring 5.9 cm, likely a cyst. Recommend follow-up ultrasound to ensure simple cystic composition.     Objective:  VS:  HT:    WT:   BMI:     BP:(!) 178/125  HR:98bpm  TEMP:98.6 F (37 C)(Oral)  RESP:  Physical Exam  Ortho Exam Imaging: No results found.

## 2019-01-07 NOTE — Procedures (Signed)
Lumbar Epidural Steroid Injection - Interlaminar Approach with Fluoroscopic Guidance  Patient: Pamela [name]      Date of Birth: 23-Apr-1971 MRN: [id number] PCP: Owens Loffler, MD      Visit Date: 12/10/2018   Universal Protocol:     Consent Given By: the patient  Position: PRONE  Additional Comments: Vital signs were monitored before and after the procedure. Patient was prepped and draped in the usual sterile fashion. The correct patient, procedure, and site was verified.   Injection Procedure Details:  Procedure Site One Meds Administered:  Meds ordered this encounter  Medications  . methylPREDNISolone acetate (DEPO-MEDROL) injection 80 mg     Laterality: Left  Location/Site:  L4-L5  Needle size: 20 G  Needle type: Tuohy  Needle Placement: Paramedian epidural  Findings:   -Comments: Excellent flow of contrast into the epidural space.  Procedure Details: Using a paramedian approach from the side mentioned above, the region overlying the inferior lamina was localized under fluoroscopic visualization and the soft tissues overlying this structure were infiltrated with 4 ml. of 1% Lidocaine without Epinephrine. The Tuohy needle was inserted into the epidural space using a paramedian approach.   The epidural space was localized using loss of resistance along with lateral and bi-planar fluoroscopic views.  After negative aspirate for air, blood, and CSF, a 2 ml. volume of Isovue-250 was injected into the epidural space and the flow of contrast was observed. Radiographs were obtained for documentation purposes.    The injectate was administered into the level noted above.   Additional Comments:  The patient tolerated the procedure well Dressing: Band-Aid    Post-procedure details: Patient was observed during the procedure. Post-procedure instructions were reviewed.  Patient left the clinic in stable condition.

## 2019-03-03 ENCOUNTER — Ambulatory Visit: Payer: Self-pay | Admitting: Family Medicine

## 2019-03-03 ENCOUNTER — Encounter: Payer: Self-pay | Admitting: Family Medicine

## 2019-03-03 ENCOUNTER — Telehealth: Payer: Self-pay

## 2019-03-03 ENCOUNTER — Other Ambulatory Visit: Payer: Self-pay

## 2019-03-03 VITALS — BP 160/108 | HR 94 | Temp 98.4°F | Ht 67.0 in | Wt 204.0 lb

## 2019-03-03 DIAGNOSIS — I10 Essential (primary) hypertension: Secondary | ICD-10-CM

## 2019-03-03 MED ORDER — LISINOPRIL-HYDROCHLOROTHIAZIDE 20-12.5 MG PO TABS: 1.0000 | ORAL_TABLET | Freq: Every day | ORAL | 2 refills | Status: DC

## 2019-03-03 NOTE — Progress Notes (Signed)
   Dr. Frederico Hamman T. Tamey Wanek, MD, [facility] and [facility] Medicine [place], [address] Phone: [phone] Fax: [phone]  03/03/2019  Patient: Pamela [name], MRN: [id number], DOB: 04-28-1971, 48 y.o.  Primary Physician:  Owens Loffler, MD   Chief Complaint  Patient presents with  . Hypertension   Subjective:   Pamela [name] is a 48 y.o. very pleasant female patient who presents with the following:  BP has been high before.  Was higher before.  194/125 this morning.    Wt Readings from Last 3 Encounters:  03/03/19 204 lb (92.5 kg)  12/09/18 195 lb (88.5 kg)  11/25/18 195 lb (88.5 kg)    She feels OK. No significant HA No CP  Essential hypertension  Start BP meds with recheck and BMP in 4 weeks  Follow-up: Return in about 4 weeks (around 03/31/2019).  Meds ordered this encounter  Medications  . lisinopril-hydrochlorothiazide (PRINZIDE,ZESTORETIC) 20-12.5 MG tablet    Sig: Take 1 tablet by mouth daily.    Dispense:  30 tablet    Refill:  2   Signed,  Rionna Feltes T. Ahonesty Woodfin, MD   Outpatient Encounter Medications as of 03/03/2019  Medication Sig  . lisinopril-hydrochlorothiazide (PRINZIDE,ZESTORETIC) 20-12.5 MG tablet Take 1 tablet by mouth daily.  . [DISCONTINUED] baclofen (LIORESAL) 10 MG tablet Take 1 tablet (10 mg total) by mouth 3 (three) times daily. (Patient not taking: Reported on 03/03/2019)  . [DISCONTINUED] gabapentin (NEURONTIN) 300 MG capsule Take 1 capsule (300 mg total) by mouth at bedtime for 3 days, THEN 1 capsule (300 mg total) 2 (two) times daily for 3 days, THEN 1 capsule (300 mg total) 3 (three) times daily for 25 days.  . [DISCONTINUED] HYDROcodone-acetaminophen (NORCO/VICODIN) 5-325 MG tablet Take 1 tablet by mouth every 6 (six) hours as needed. (Patient not taking: Reported on 03/03/2019)  . [DISCONTINUED] lidocaine (LIDODERM) 5 % Place 1 patch onto the skin daily. Remove & Discard patch within 12 hours or as  directed by MD (Patient not taking: Reported on 03/03/2019)  . [DISCONTINUED] naproxen (NAPROSYN) 500 MG tablet Take 1 tablet (500 mg total) by mouth 2 (two) times daily with a meal. (Patient not taking: Reported on 03/03/2019)  . [DISCONTINUED] traMADol (ULTRAM) 50 MG tablet Take by mouth every 6 (six) hours as needed.   No facility-administered encounter medications on file as of 03/03/2019.

## 2019-03-03 NOTE — Telephone Encounter (Signed)
Pt was seen at eye dr earlier today for stye and BP was 194/125.  12/10/18 BP 178/125. Pt said she stopped taking BP med on her own over 1 yr ago. No CP, SOB,H/A,dizziness, not extremity weakness. Pt would not know BP was up unless told by eye dr office. Pt last saw Dr Lorelei Pont 03/10/16 and saw Gentry Fitz NP for acute visit on 11/23/18. Butch Penny CMA advised could schedule with Dr Lorelei Pont today at 3:40. If pt condition changes or worsens prior to appt pt to go to ED. Pt voiced understanding.FYI to Dr Lorelei Pont.

## 2019-03-29 NOTE — Progress Notes (Signed)
     Cesare Sumlin T. Litisha Guagliardo, MD Primary Care and [place] at [facility] [place], [address] Phone: [phone]  FAX: [phone]  Pamela [name] - 48 y.o. female  MRN [id number]  Date of Birth: 1971-11-03  Visit Date: 03/31/2019  PCP: Owens Loffler, MD  Referred by: Owens Loffler, MD  Virtual Visit via Video Note:  I connected with  Pamela [name] on 03/31/2019  8:00 AM EDT by a video enabled telemedicine application and verified that I am speaking with the correct person using two identifiers.   Location patient: home phone or cell phone Location provider: work or home office Consent: Verbal consent directly obtained from Pamela [name]. Persons participating in the virtual visit: patient, provider  I discussed the limitations of evaluation and management by telemedicine and the availability of in person appointments. The patient expressed understanding and agreed to proceed.  History of Present Illness:  Video f/u of blood pressure.  Doing a lot better, BP's running in 140's/90's Occ HA still  Review of Systems as above: See pertinent positives and pertinent negatives per HPI No acute distress verbally  Past Medical History, Surgical History, Social History, Family History, Problem List, Medications, and Allergies have been reviewed and updated if relevant.   Observations/Objective/Exam:  An attempt was made to discern vital signs over the phone and per patient if applicable and possible.   General:    Alert, Oriented, appears well and in no acute distress HEENT:     Atraumatic, conjunctiva clear, no obvious abnormalities on inspection of external nose and ears.  Neck:    Normal movements of the head and neck Pulmonary:     On inspection no signs of respiratory distress, breathing rate appears normal, no obvious gross SOB, gasping or wheezing Cardiovascular:    No obvious cyanosis Musculoskeletal:    Moves  all visible extremities without noticeable abnormality Psych / Neurological:     Pleasant and cooperative, no obvious depression or anxiety, speech and thought processing grossly intact  Assessment and Plan:  Essential hypertension - Plan: lisinopril-hydrochlorothiazide (PRINZIDE,ZESTORETIC) 20-25 MG tablet  Incr BP meds slightly She will check BP when in [place], pharmacy, etc.  I discussed the assessment and treatment plan with the patient. The patient was provided an opportunity to ask questions and all were answered. The patient agreed with the plan and demonstrated an understanding of the instructions.   The patient was advised to call back or seek an in-person evaluation if the symptoms worsen or if the condition fails to improve as anticipated.  Follow-up: prn unless noted otherwise below No follow-ups on file.  Meds ordered this encounter  Medications  . lisinopril-hydrochlorothiazide (PRINZIDE,ZESTORETIC) 20-25 MG tablet    Sig: Take 1 tablet by mouth daily.    Dispense:  90 tablet    Refill:  3   No orders of the defined types were placed in this encounter.   Signed,  Maud Deed. Micayla Brathwaite, MD

## 2019-03-30 ENCOUNTER — Other Ambulatory Visit: Payer: Self-pay | Admitting: Family Medicine

## 2019-03-31 ENCOUNTER — Other Ambulatory Visit: Payer: Self-pay

## 2019-03-31 ENCOUNTER — Ambulatory Visit (INDEPENDENT_AMBULATORY_CARE_PROVIDER_SITE_OTHER): Payer: Self-pay | Admitting: Family Medicine

## 2019-03-31 ENCOUNTER — Encounter: Payer: Self-pay | Admitting: Family Medicine

## 2019-03-31 VITALS — Ht 67.0 in

## 2019-03-31 DIAGNOSIS — I10 Essential (primary) hypertension: Secondary | ICD-10-CM

## 2019-03-31 MED ORDER — LISINOPRIL-HYDROCHLOROTHIAZIDE 20-25 MG PO TABS: 1.0000 | ORAL_TABLET | Freq: Every day | ORAL | 3 refills | Status: DC

## 2019-07-05 NOTE — Progress Notes (Signed)
Pamela Usman T. Lejon Afzal, MD Primary Care and [place] at [facility] [place], [address] Phone: [phone]  FAX: [phone]  Pamela [name] - 48 y.o. female  MRN [id number]  Date of Birth: 08-Jul-1971  Visit Date: 07/06/2019  PCP: Owens Loffler, MD  Referred by: Owens Loffler, MD Chief Complaint  Patient presents with  . tingling in left arm    started a month ago, thumb goes numb now   Virtual Visit via Video Note:  I connected with  Pamela [name] on 07/06/2019  8:00 AM EDT by a video enabled telemedicine application and verified that I am speaking with the correct person using two identifiers.   Location patient: home computer, tablet, or smartphone Location provider: work or home office Consent: Verbal consent directly obtained from Pamela [name]. Persons participating in the virtual visit: patient, provider  I discussed the limitations of evaluation and management by telemedicine and the availability of in person appointments. The patient expressed understanding and agreed to proceed.  History of Present Illness:  She presents with some hand numbness. Left sided.  She is having some radicular pain down the left side and she is also having some numbness all the way down to her thumb.  She is not had any trauma or injury.  She is not anything significantly like this in the past, but she does have a history of some low back pain with sciatica peer  Review of Systems as above: See pertinent positives and pertinent negatives per HPI No acute distress verbally  Past Medical History, Surgical History, Social History, Family History, Problem List, Medications, and Allergies have been reviewed and updated if relevant.   Observations/Objective/Exam:  An attempt was made to discern vital signs over the phone and per patient if applicable and possible.   General:    Alert, Oriented, appears well and in no acute distress  HEENT:     Atraumatic, conjunctiva clear, no obvious abnormalities on inspection of external nose and ears.  Neck:    Normal movements of the head and neck Pulmonary:     On inspection no signs of respiratory distress, breathing rate appears normal, no obvious gross SOB, gasping or wheezing Cardiovascular:    No obvious cyanosis Musculoskeletal:    Moves all visible extremities without noticeable abnormality Psych / Neurological:     Pleasant and cooperative, no obvious depression or anxiety, speech and thought processing grossly intact  Assessment and Plan:    ICD-10-CM   1. Cervical radiculopathy, acute  M54.12    >10 minutes spent in face to face time with patient, >50% spent in counselling or coordination of care  Conservative care with f/u 3-4 weeks if not better  I discussed the assessment and treatment plan with the patient. The patient was provided an opportunity to ask questions and all were answered. The patient agreed with the plan and demonstrated an understanding of the instructions.   The patient was advised to call back or seek an in-person evaluation if the symptoms worsen or if the condition fails to improve as anticipated.  Follow-up: prn unless noted otherwise below No follow-ups on file.  Meds ordered this encounter  Medications  . predniSONE (DELTASONE) 20 MG tablet    Sig: 2 tabs po daily for 5 days, then 1 tab po daily for 5 days    Dispense:  15 tablet    Refill:  0  . amitriptyline (ELAVIL) 25 MG tablet  Sig: Take 1 tablet (25 mg total) by mouth at bedtime.    Dispense:  30 tablet    Refill:  1   No orders of the defined types were placed in this encounter.   Signed,  Maud Deed. Jalea Bronaugh, MD

## 2019-07-06 ENCOUNTER — Encounter: Payer: Self-pay | Admitting: Family Medicine

## 2019-07-06 ENCOUNTER — Other Ambulatory Visit: Payer: Self-pay

## 2019-07-06 ENCOUNTER — Ambulatory Visit (INDEPENDENT_AMBULATORY_CARE_PROVIDER_SITE_OTHER): Payer: Self-pay | Admitting: Family Medicine

## 2019-07-06 VITALS — Temp 98.1°F | Wt 197.0 lb

## 2019-07-06 DIAGNOSIS — M5412 Radiculopathy, cervical region: Secondary | ICD-10-CM

## 2019-07-06 MED ORDER — PREDNISONE 20 MG PO TABS: ORAL_TABLET | ORAL | 0 refills | Status: DC

## 2019-07-06 MED ORDER — AMITRIPTYLINE HCL 25 MG PO TABS: 25.0000 mg | ORAL_TABLET | Freq: Every day | ORAL | 1 refills | Status: DC

## 2019-08-24 ENCOUNTER — Encounter: Payer: Self-pay | Admitting: Family Medicine

## 2019-08-24 ENCOUNTER — Telehealth: Payer: Self-pay

## 2019-08-24 ENCOUNTER — Ambulatory Visit (INDEPENDENT_AMBULATORY_CARE_PROVIDER_SITE_OTHER): Payer: Self-pay | Admitting: Family Medicine

## 2019-08-24 VITALS — BP 129/80 | HR 84 | Temp 97.6°F | Ht 67.0 in

## 2019-08-24 DIAGNOSIS — R42 Dizziness and giddiness: Secondary | ICD-10-CM

## 2019-08-24 MED ORDER — ONDANSETRON 8 MG PO TBDP: 8.0000 mg | ORAL_TABLET | Freq: Three times a day (TID) | ORAL | 1 refills | Status: DC | PRN

## 2019-08-24 NOTE — Telephone Encounter (Signed)
On 08/22/19 started with dizziness and nausea; pt said never had before; dizziness and off balance when first stands the room does spin but spinning stops immediately after standing. When laying down pt is OK. No H/A now but has slight H/A on and off this wk. No CP or SOB. No vomiting but feels very nauseated. Normal BM each morning. Pt had chilling x 1 this wk. Advised pt when standing from sitting or laying position to gradually stand slowly. Pt scheduled virtual visit today at 3:40 with Dr Lorelei Pont. ED precautions given.

## 2019-08-24 NOTE — Progress Notes (Signed)
Terrell Shimko T. Nyeema Want, MD Primary Care and [place] at [facility] [place], [address] Phone: [phone]  FAX: [phone]  Pamela [name] - 48 y.o. female  MRN [id number]  Date of Birth: Dec 30, 1970  Visit Date: 08/24/2019  PCP: Owens Loffler, MD  Referred by: Owens Loffler, MD Chief Complaint  Patient presents with  . Dizziness  . Nausea  . Headache   Virtual Visit via Video Note:  I connected with  Pamela [name] on 08/24/2019  3:40 PM EDT by a video enabled telemedicine application and verified that I am speaking with the correct person using two identifiers.   Location patient: home computer, tablet, or smartphone Location provider: work or home office Consent: Verbal consent directly obtained from Pamela [name]. Persons participating in the virtual visit: patient, provider  I discussed the limitations of evaluation and management by telemedicine and the availability of in person appointments. The patient expressed understanding and agreed to proceed.  History of Present Illness:  Pamela [name] is a nice young lady and she presents video conference talking about some persistent vertigo, dizziness, nauseousness and headache.  This is worsened by bending over and with rotational movements in bed.  She has not tried anything at all.  Review of Systems as above: See pertinent positives and pertinent negatives per HPI No acute distress verbally  Past Medical History, Surgical History, Social History, Family History, Problem List, Medications, and Allergies have been reviewed and updated if relevant.   Observations/Objective/Exam:  An attempt was made to discern vital signs over the phone and per patient if applicable and possible.   General:    Alert, Oriented, appears well and in no acute distress HEENT:     Atraumatic, conjunctiva clear, no obvious abnormalities on inspection of external nose and ears.  Neck:  Normal movements of the head and neck Pulmonary:     On inspection no signs of respiratory distress, breathing rate appears normal, no obvious gross SOB, gasping or wheezing Cardiovascular:    No obvious cyanosis Musculoskeletal:    Moves all visible extremities without noticeable abnormality Psych / Neurological:     Pleasant and cooperative, no obvious depression or anxiety, speech and thought processing grossly intact  Assessment and Plan:    ICD-10-CM   1. Vertigo  R42 ondansetron (ZOFRAN ODT) 8 MG disintegrating tablet   Told her to buy some Dramamine over-the-counter.  Also gave her some Zofran to take for nausea.  Typically this does resolve without any other sort of intervention, but if symptoms persist greater than 3 to 4 weeks we can get vestibular rehab involved.  I discussed the assessment and treatment plan with the patient. The patient was provided an opportunity to ask questions and all were answered. The patient agreed with the plan and demonstrated an understanding of the instructions.   The patient was advised to call back or seek an in-person evaluation if the symptoms worsen or if the condition fails to improve as anticipated.  Follow-up: prn unless noted otherwise below No follow-ups on file.  Meds ordered this encounter  Medications  . ondansetron (ZOFRAN ODT) 8 MG disintegrating tablet    Sig: Take 1 tablet (8 mg total) by mouth every 8 (eight) hours as needed for nausea or vomiting.    Dispense:  20 tablet    Refill:  1   No orders of the defined types were placed in this encounter.   Signed,  Maud Deed.  Artie Takayama, MD

## 2019-08-25 ENCOUNTER — Encounter: Payer: Self-pay | Admitting: Family Medicine

## 2019-09-05 ENCOUNTER — Other Ambulatory Visit: Payer: Self-pay | Admitting: Family Medicine

## 2019-09-05 NOTE — Telephone Encounter (Signed)
Last office visit 08/24/2019 for vertigo.  Last refilled 07/06/2019 for #30 with 1 refill.  No future appointments.  Refill?

## 2020-01-05 ENCOUNTER — Ambulatory Visit: Payer: Self-pay | Attending: Internal Medicine

## 2020-01-05 DIAGNOSIS — Z20822 Contact with and (suspected) exposure to covid-19: Secondary | ICD-10-CM | POA: Insufficient documentation

## 2020-01-06 LAB — NOVEL CORONAVIRUS, NAA: SARS-CoV-2, NAA: NOT DETECTED

## 2020-04-30 ENCOUNTER — Encounter: Payer: Self-pay | Admitting: Family Medicine

## 2020-04-30 ENCOUNTER — Other Ambulatory Visit: Payer: Self-pay

## 2020-04-30 ENCOUNTER — Ambulatory Visit: Payer: Self-pay | Admitting: Family Medicine

## 2020-04-30 VITALS — BP 138/90 | HR 60 | Temp 97.7°F | Ht 67.0 in | Wt 181.2 lb

## 2020-04-30 DIAGNOSIS — M7541 Impingement syndrome of right shoulder: Secondary | ICD-10-CM

## 2020-04-30 DIAGNOSIS — M25511 Pain in right shoulder: Secondary | ICD-10-CM

## 2020-04-30 MED ORDER — PREDNISONE 20 MG PO TABS: ORAL_TABLET | ORAL | 0 refills | Status: DC

## 2020-04-30 NOTE — Progress Notes (Signed)
Denyse Fillion T. Bonnee Zertuche, MD, [place] at [facility] [place], [address]  Phone: [phone]  FAX: [phone]  Jalayia C Nations - 49 y.o. female  MRN [id number]  Date of Birth: 08-Sep-1971  Date: 04/30/2020  PCP: Owens Loffler, MD  Referral: Owens Loffler, MD  Chief Complaint  Patient presents with  . Shoulder Pain    right, started about a month ago    This visit occurred during the SARS-CoV-2 public health emergency.  Safety protocols were in place, including screening questions prior to the visit, additional usage of staff PPE, and extensive cleaning of exam room while observing appropriate contact time as indicated for disinfecting solutions.   Subjective:   Pamela [name] is a 49 y.o. very pleasant female patient with Body mass index is 28.39 kg/m. who presents with the following:  She presents with some right-sided shoulder pain that is been present for approximately 1 month.  1 month ago and start with some pain and loss of motion and put on some topicals.  Hurts with driving.  Has gotten worse and worse. Over time and keeping up at night.  She does have pain with terminal range of motion and abduction as well as internal range of motion.  She does have some decreased range of motion in terms of internal range of motion, but the remainder of her shoulder exam has full motion.  She is having some difficulty with washing her hair.  Trouble with basic activites at home.   Trouble with washing.   Last week started to hear some popping and catching. No injury. In the am when walks her dog will pull her.     Review of Systems is noted in the HPI, as appropriate   Objective:   BP 138/90   Pulse 60   Temp 97.7 F (36.5 C) (Skin)   Ht 5\' 7"  (1.702 m)   Wt 181 lb 4 oz (82.2 kg)   LMP 03/09/2012 (LMP Unknown)   SpO2 99%   BMI 28.39 kg/m    GEN: No acute distress;  alert,appropriate. PULM: Breathing comfortably in no respiratory distress PSYCH: Normally interactive.   Shoulder: r Inspection: No muscle wasting or winging Ecchymosis/edema: neg  AC joint, scapula, clavicle: NT Cervical spine: NT, full ROM Spurling's: neg Abduction: full, 5/5 Flexion: full, 5/5 IR, , lift-off: 5/5, 30 degrees decreased compared to the contralateral side ER at neutral: full, 5/5 AC crossover: neg Neer: pos Hawkins: pos Drop Test: neg Empty Can: pos Supraspinatus insertion: mild-mod T Bicipital groove: NT Speed's: neg Yergason's: neg Sulcus sign: neg Scapular dyskinesis: none C5-T1 intact  Neuro: Sensation intact Grip 5/5   Radiology: No results found.  Assessment and Plan:     ICD-10-CM   1. Impingement syndrome of right shoulder  M75.41   2. 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  Gave classic HEP.  Follow-up 1 month if not improving.   Meds ordered this encounter  Medications  . predniSONE (DELTASONE) 20 MG tablet    Sig: 2 tabs po daily for 5 days, then 1 tab po daily for 5 days    Dispense:  15 tablet    Refill:  0   There are no discontinued medications. No orders of the defined types were  placed in this encounter.   Signed,  Maud Deed. Xoie Kreuser, MD   Outpatient Encounter Medications as of 04/30/2020  Medication Sig  . amitriptyline (ELAVIL) 25 MG tablet TAKE 1 TABLET(25 MG) BY MOUTH AT BEDTIME (Patient taking differently: daily as needed. )  . lisinopril-hydrochlorothiazide (PRINZIDE,ZESTORETIC) 20-25 MG tablet Take 1 tablet by mouth daily.  . ondansetron (ZOFRAN ODT) 8 MG disintegrating tablet Take 1 tablet (8 mg total) by mouth every 8 (eight) hours as needed for nausea or vomiting.  . predniSONE (DELTASONE) 20 MG tablet 2 tabs po daily for 5 days, then 1 tab po daily for 5  days   No facility-administered encounter medications on file as of 04/30/2020.

## 2020-08-14 NOTE — Progress Notes (Signed)
Pamela Spilman T. Beulah Capobianco, MD, [place] at [facility] [place], [address]  Phone: [phone]  FAX: [phone]  Pamela [name] - 49 y.o. female  MRN [id number]  Date of Birth: 1971/12/02  Date: 08/15/2020  PCP: Owens Loffler, MD  Referral: Owens Loffler, MD  Chief Complaint  Patient presents with  . Shoulder Pain    Right    This visit occurred during the SARS-CoV-2 public health emergency.  Safety protocols were in place, including screening questions prior to the visit, additional usage of staff PPE, and extensive cleaning of exam room while observing appropriate contact time as indicated for disinfecting solutions.   Subjective:   Pamela [name] is a 49 y.o. very pleasant female patient with Body mass index is 27.1 kg/m. who presents with the following:  I saw the patient 3 and half months ago and at that time she was having some pain with an arc of motion in the plane of abduction as well as pain with terminal internal range of motion.  She also had a loss of internal range of motion compared to the contralateral side as well.  I thought that she was having some rotator cuff tendinopathy as well as some  GIRD (Glenohumeral Internal Rotational Deficiency).  I gave her classic rehab program and I asked her to follow-up in 1 month if her symptoms persisted.  Today she is here in follow-up.  Pain with using and not using it as much.  Cannot do a lot of her normal things like bowling. Hard time sleeping at night.   She now is having more of a dull, deep ache in the shoulder and some pain with terminal motion and pain at night,    Combo, r  Review of Systems is noted in the HPI, as appropriate   Objective:   BP (!) 150/100   Pulse 87   Temp 97.7 F (36.5 C) (Temporal)   Ht 5\' 7"  (1.702 m)   Wt 173 lb (78.5 kg)   LMP 03/09/2012 (LMP Unknown)   SpO2 98%   BMI 27.10 kg/m     GEN: No acute distress; alert,appropriate. PULM: Breathing comfortably in no respiratory distress PSYCH: Normally interactive.   Shoulder: R and L Inspection: No muscle wasting or winging Ecchymosis/edema: neg  AC joint, scapula, clavicle: NT Cervical spine: NT, full ROM Spurling's: neg ABNORMAL SIDE TESTED: Right right UNLESS OTHERWISE NOTED, THE CONTRALATERAL SIDE HAS FULL RANGE OF MOTION. Abduction: 5/5, full range of motion Flexion: 5/5, full range of motion IR, lift-off: 5/5. TESTED AT 90 DEGREES OF ABDUCTION, LIMITED TO 10 DEGREES ER at neutral:  5/5, TESTED AT 90 DEGREES OF ABDUCTION, LIMITED TO 75 DEGREES AC crossover and compression: PAIN Drop Test: neg Empty Can: neg Supraspinatus insertion: NT Bicipital groove: Tender to palpation Speeds, Yergason, Michel Bickers as well as Neer testing are positive C5-T1 intact Sensation intact Grip 5/5    Radiology: No results found.  Assessment and Plan:     ICD-10-CM   1. Impingement syndrome of right shoulder  M75.41 methylPREDNISolone acetate (DEPO-MEDROL) injection 80 mg  2. Acute pain of right shoulder  M25.511   3. Limitation of joint motion  M25.60    Letter the patient has some impingement and rotator cuff tendinopathy, but I worry that she is starting to develop a frozen shoulder.  She does have loss of motion in 2 planes of motion.  I  reviewed with her a comprehensive rehab program.  Aspiration/Injection Procedure Note Jazma C Mongillo 47/03/1971 Date of procedure: 08/15/2020  Procedure: Large Joint Aspiration / Injection of Shoulder, Intraarticular,R Indications: Pain  Procedure Details Verbal consent was obtained from the patient. Risks including infection explained and contrasted with benefits and alternatives. Patient prepped with Chloraprep and Ethyl Chloride used for anesthesia. An intraarticular shoulder injection was performed using the posterior approach. The patient tolerated the procedure well and  had decreased pain post injection. No complications. Injection: 4 cc of Lidocaine 1% and 1 mL Depo-Medrol 40 mg. Needle: 21 gauge, 2 inch Medication: Depo-Medrol 40 mg  Aspiration/Injection Procedure Note Ardis C Tison [birth year] Date of procedure: 08/15/2020  Procedure: Large Joint Aspiration / Injection of Shoulder, Subacromial, R Indications: Pain  Procedure Details Verbal consent was obtained from the patient. Risks (including rare infection), benefits, and alternatives were explained. Patient prepped with Chloraprep and Ethyl Chloride used for anesthesia. The subacromial space was injected using the posterior approach. The patient tolerated the procedure well and had decreased pain post injection. No complications. Injection: 4 cc of Lidocaine 1% and 1 mL of Depo-Medrol 40 mg. Needle: 22 gauge, 1 1/2 inch Medication: Depo-Medrol 40 mg  Follow-up: Return in about 2 months (around 10/15/2020).  Meds ordered this encounter  Medications  . methylPREDNISolone acetate (DEPO-MEDROL) injection 80 mg   Medications Discontinued During This Encounter  Medication Reason  . amitriptyline (ELAVIL) 25 MG tablet Completed Course  . ondansetron (ZOFRAN ODT) 8 MG disintegrating tablet Completed Course  . predniSONE (DELTASONE) 20 MG tablet Completed Course   No orders of the defined types were placed in this encounter.   Signed,  Maud Deed. Lilit Cinelli, MD   Outpatient Encounter Medications as of 08/15/2020  Medication Sig  . lisinopril-hydrochlorothiazide (PRINZIDE,ZESTORETIC) 20-25 MG tablet Take 1 tablet by mouth daily.  . [DISCONTINUED] amitriptyline (ELAVIL) 25 MG tablet TAKE 1 TABLET(25 MG) BY MOUTH AT BEDTIME (Patient taking differently: daily as needed. )  . [DISCONTINUED] ondansetron (ZOFRAN ODT) 8 MG disintegrating tablet Take 1 tablet (8 mg total) by mouth every 8 (eight) hours as needed for nausea or vomiting.  . [DISCONTINUED] predniSONE (DELTASONE) 20 MG tablet 2 tabs po daily  for 5 days, then 1 tab po daily for 5 days  . [EXPIRED] methylPREDNISolone acetate (DEPO-MEDROL) injection 80 mg    No facility-administered encounter medications on file as of 08/15/2020.

## 2020-08-15 ENCOUNTER — Encounter: Payer: Self-pay | Admitting: Family Medicine

## 2020-08-15 ENCOUNTER — Ambulatory Visit: Payer: Self-pay | Admitting: Family Medicine

## 2020-08-15 ENCOUNTER — Other Ambulatory Visit: Payer: Self-pay

## 2020-08-15 VITALS — BP 150/100 | HR 87 | Temp 97.7°F | Ht 67.0 in | Wt 173.0 lb

## 2020-08-15 DIAGNOSIS — M2560 Stiffness of unspecified joint, not elsewhere classified: Secondary | ICD-10-CM

## 2020-08-15 DIAGNOSIS — M7541 Impingement syndrome of right shoulder: Secondary | ICD-10-CM

## 2020-08-15 DIAGNOSIS — M25511 Pain in right shoulder: Secondary | ICD-10-CM

## 2020-08-15 MED ORDER — METHYLPREDNISOLONE ACETATE 40 MG/ML IJ SUSP
80.0000 mg | Freq: Once | INTRAMUSCULAR | Status: AC
  Administered 2020-08-15: 80 mg via INTRA_ARTICULAR

## 2020-10-11 ENCOUNTER — Encounter: Payer: Self-pay | Admitting: Family Medicine

## 2021-01-02 ENCOUNTER — Ambulatory Visit: Payer: Self-pay | Admitting: Family Medicine

## 2021-01-02 DIAGNOSIS — Z0289 Encounter for other administrative examinations: Secondary | ICD-10-CM

## 2021-02-06 ENCOUNTER — Other Ambulatory Visit: Payer: Self-pay

## 2021-02-06 ENCOUNTER — Encounter: Payer: Self-pay | Admitting: Family Medicine

## 2021-02-06 ENCOUNTER — Ambulatory Visit: Payer: Self-pay | Admitting: Family Medicine

## 2021-02-06 VITALS — BP 140/90 | HR 81 | Temp 98.0°F | Ht 67.0 in | Wt 171.8 lb

## 2021-02-06 DIAGNOSIS — M7501 Adhesive capsulitis of right shoulder: Secondary | ICD-10-CM

## 2021-02-06 NOTE — Progress Notes (Signed)
    Pamela Bourdeau T. Janiece Scovill, MD, [place] at [facility] [place], [address]  Phone: [phone]  FAX: [phone]  Pamela [name] - 50 y.o. female  MRN [id number]  Date of Birth: 1971/01/21  Date: 02/06/2021  PCP: Owens Loffler, MD  Referral: Owens Loffler, MD  Chief Complaint  Patient presents with  . Shoulder Pain    Right    This visit occurred during the SARS-CoV-2 public health emergency.  Safety protocols were in place, including screening questions prior to the visit, additional usage of staff PPE, and extensive cleaning of exam room while observing appropriate contact time as indicated for disinfecting solutions.   Subjective:   Pamela [name] is a 50 y.o. very pleasant female patient with Body mass index is 26.9 kg/m. who presents with the following:  She presents with some ongoing right-sided shoulder pain, and she has had some shoulder pain off and on for greater than 6 months.  She has been having pain with a painful arc of motion and some impingement syndrome.  She presents today with some worsening shoulder pain and loss of motion - notably worse motion than before.  Throbs and up at night.  Picking things up.  Crossing over.  Some limitation, taking tylenol. Other topicals, but none of these things have really helped all that much.  Review of Systems is noted in the HPI, as appropriate   Objective:   BP 140/90   Pulse 81   Temp 98 F (36.7 C) (Temporal)   Ht 5\' 7"  (1.702 m)   Wt 171 lb 12 oz (77.9 kg)   LMP 03/09/2012 (LMP Unknown)   SpO2 98%   BMI 26.90 kg/m    Shoulder: R and L Inspection: No muscle wasting or winging Ecchymosis/edema: neg  AC joint, scapula, clavicle: NT Cervical spine: NT, full ROM Spurling's: neg ABNORMAL SIDE TESTED: R UNLESS OTHERWISE NOTED, THE CONTRALATERAL SIDE HAS FULL RANGE OF MOTION. Abduction: 5/5, LIMITED TO 140  DEGREES Flexion: 5/5, LIMITED TO 140 DEGNO ROM  IR, lift-off: 5/5. TESTED AT 90 DEGREES OF ABDUCTION, LIMITED TO 5 DEGREES ER at neutral:  5/5, TESTED AT 90 DEGREES OF ABDUCTION, LIMITED TO 35 DEGREES AC crossover and compression: PAIN Drop Test: neg Empty Can: neg Supraspinatus insertion: NT Bicipital groove: NT ALL OTHER SPECIAL TESTING EQUIVOCAL GIVEN LOSS OF MOTION C5-T1 intact Sensation intact Grip 5/5   Radiology: No results found.  Assessment and Plan:     ICD-10-CM   1. 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   Right now, start [facility] frozen shoulder protocol.  Follow-up: Return in about 10 weeks (around 04/17/2021).  Signed,  Maud Deed. Jaiquan Temme, MD   Outpatient Encounter Medications as of 02/06/2021  Medication Sig  . lisinopril-hydrochlorothiazide (PRINZIDE,ZESTORETIC) 20-25 MG tablet Take 1 tablet by mouth daily.   No facility-administered encounter medications on file as of 02/06/2021.

## 2021-08-07 ENCOUNTER — Ambulatory Visit: Payer: Self-pay | Admitting: Family Medicine

## 2021-10-24 ENCOUNTER — Encounter (INDEPENDENT_AMBULATORY_CARE_PROVIDER_SITE_OTHER): Payer: Self-pay | Admitting: Podiatry

## 2021-10-24 DIAGNOSIS — M2041 Other hammer toe(s) (acquired), right foot: Secondary | ICD-10-CM

## 2021-10-24 NOTE — Progress Notes (Signed)
This encounter was created in error - please disregard.

## 2022-06-19 ENCOUNTER — Telehealth: Payer: Self-pay

## 2022-06-19 ENCOUNTER — Ambulatory Visit: Payer: Self-pay | Admitting: Family Medicine

## 2022-06-19 NOTE — Telephone Encounter (Signed)
[facility] Night - Client Nonclinical Telephone Record  [facility] Client [name] - Client Client Site [facility] Provider Owens Loffler - MD Contact Type Call Who Is Calling Patient / Member / Family / Caregiver Caller Name Naleah Corti Caller Phone Number [phone] Patient Name Pamela [name] Patient DOB 1971-10-21 Call Type Message Only Information Provided Reason for Call Request to [facility] Appointment Initial Comment Caller needs to cancel her appt at 1140 at 629. Patient request to speak to RN No Additional Comment Caller declined triage. Disp. Time Disposition Final User 06/19/2022 4:59:46 AM General Information Provided Yes Cherrie Gauze Call Closed By: Cherrie Gauze Transaction Date/Time: 06/19/2022 4:57:15 AM (ET

## 2022-06-19 NOTE — Telephone Encounter (Signed)
Noted.  Looks like appointment has been cancelled.

## 2023-02-03 ENCOUNTER — Encounter: Payer: Self-pay | Admitting: Obstetrics & Gynecology
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
## 2023-06-24 ENCOUNTER — Ambulatory Visit (INDEPENDENT_AMBULATORY_CARE_PROVIDER_SITE_OTHER): Payer: Self-pay | Admitting: Family Medicine

## 2023-06-24 ENCOUNTER — Encounter: Payer: Self-pay | Admitting: Family Medicine

## 2023-06-24 VITALS — BP 130/90 | HR 88 | Temp 98.1°F | Ht 67.5 in | Wt 159.5 lb

## 2023-06-24 DIAGNOSIS — I10 Essential (primary) hypertension: Secondary | ICD-10-CM

## 2023-06-24 DIAGNOSIS — Z23 Encounter for immunization: Secondary | ICD-10-CM

## 2023-06-24 DIAGNOSIS — Z13220 Encounter for screening for lipoid disorders: Secondary | ICD-10-CM

## 2023-06-24 DIAGNOSIS — Z0000 Encounter for general adult medical examination without abnormal findings: Secondary | ICD-10-CM

## 2023-06-24 DIAGNOSIS — Z131 Encounter for screening for diabetes mellitus: Secondary | ICD-10-CM

## 2023-06-24 DIAGNOSIS — Z1159 Encounter for screening for other viral diseases: Secondary | ICD-10-CM

## 2023-06-24 DIAGNOSIS — Z79899 Other long term (current) drug therapy: Secondary | ICD-10-CM

## 2023-06-24 DIAGNOSIS — Z1211 Encounter for screening for malignant neoplasm of colon: Secondary | ICD-10-CM

## 2023-06-24 LAB — CBC WITH DIFFERENTIAL/PLATELET
Basophils Absolute: 0 10*3/uL (ref 0.0–0.1)
Basophils Relative: 1.1 % (ref 0.0–3.0)
Eosinophils Absolute: 0.2 10*3/uL (ref 0.0–0.7)
Eosinophils Relative: 4.3 % (ref 0.0–5.0)
HCT: 37.7 % (ref 36.0–46.0)
Hemoglobin: 12 g/dL (ref 12.0–15.0)
Lymphocytes Relative: 45.4 % (ref 12.0–46.0)
Lymphs Abs: 2 10*3/uL (ref 0.7–4.0)
MCHC: 31.9 g/dL (ref 30.0–36.0)
MCV: 88.2 fl (ref 78.0–100.0)
Monocytes Absolute: 0.4 10*3/uL (ref 0.1–1.0)
Monocytes Relative: 8.3 % (ref 3.0–12.0)
Neutro Abs: 1.8 10*3/uL (ref 1.4–7.7)
Neutrophils Relative %: 40.9 % — ABNORMAL LOW (ref 43.0–77.0)
Platelets: 232 10*3/uL (ref 150.0–400.0)
RBC: 4.27 Mil/uL (ref 3.87–5.11)
RDW: 13.6 % (ref 11.5–15.5)
WBC: 4.4 10*3/uL (ref 4.0–10.5)

## 2023-06-24 LAB — BASIC METABOLIC PANEL
BUN: 10 mg/dL (ref 6–23)
CO2: 30 mEq/L (ref 19–32)
Calcium: 10 mg/dL (ref 8.4–10.5)
Chloride: 103 mEq/L (ref 96–112)
Creatinine, Ser: 0.93 mg/dL (ref 0.40–1.20)
GFR: 72.05 mL/min (ref >60.00)
Glucose, Bld: 77 mg/dL (ref 70–99)
Potassium: 4.7 mEq/L (ref 3.5–5.1)
Sodium: 138 mEq/L (ref 135–145)

## 2023-06-24 LAB — LIPID PANEL
Cholesterol: 225 mg/dL — ABNORMAL HIGH (ref 0–200)
HDL: 50.6 mg/dL (ref >39.00)
LDL Cholesterol: 162 mg/dL — ABNORMAL HIGH (ref 0–99)
NonHDL: 174.5
Total CHOL/HDL Ratio: 4
Triglycerides: 63 mg/dL (ref 0.0–149.0)
VLDL: 12.6 mg/dL (ref 0.0–40.0)

## 2023-06-24 LAB — HEPATIC FUNCTION PANEL
ALT: 5 U/L (ref 0–35)
AST: 13 U/L (ref 0–37)
Albumin: 4.4 g/dL (ref 3.5–5.2)
Alkaline Phosphatase: 43 U/L (ref 39–117)
Bilirubin, Direct: 0.1 mg/dL (ref 0.0–0.3)
Total Bilirubin: 0.4 mg/dL (ref 0.2–1.2)
Total Protein: 7.5 g/dL (ref 6.0–8.3)

## 2023-06-24 LAB — TSH: TSH: 0.88 u[IU]/mL (ref 0.35–5.50)

## 2023-06-24 LAB — HEMOGLOBIN A1C: Hgb A1c MFr Bld: 5.1 % (ref 4.6–6.5)

## 2023-06-24 MED ORDER — LISINOPRIL-HYDROCHLOROTHIAZIDE 20-25 MG PO TABS: 1.0000 | ORAL_TABLET | Freq: Every day | ORAL | 3 refills | Status: DC

## 2023-06-25 ENCOUNTER — Encounter: Payer: Self-pay | Admitting: Family Medicine

## 2023-06-25 LAB — HEPATITIS C ANTIBODY: Hepatitis C Ab: NONREACTIVE

## 2023-07-09 ENCOUNTER — Encounter: Payer: Self-pay | Admitting: Internal Medicine

## 2023-08-04 ENCOUNTER — Telehealth: Payer: Self-pay | Admitting: *Deleted

## 2023-08-04 NOTE — Telephone Encounter (Signed)
Attempt to reach pt for pre-visit. LM with call back #. Will attempt other number in profile Will attempt to reach again in 5 min due to no other # listed in profile Second attempt unsuccessful LM with instructions for pt to call # given by end of day and reschedule pre-visit or procedure will be canceled.

## 2023-08-19 ENCOUNTER — Telehealth: Payer: Self-pay | Admitting: Student

## 2023-08-19 NOTE — Telephone Encounter (Signed)
Patient responded "no" to appointment text reminder for [facility] procedure on 8/29. This RN called to verify patient wasn't coming to appointment and to offer to reschedule. No answer. Left a message asking patient to call back.

## 2023-08-20 ENCOUNTER — Encounter: Payer: Self-pay | Admitting: Internal Medicine

## 2023-08-20 ENCOUNTER — Telehealth: Payer: Self-pay | Admitting: Internal Medicine

## 2023-08-20 NOTE — Telephone Encounter (Signed)
Good Morning Dr. Leone Payor,  I called this patient at 10:50am today to see if she was coming for her procedure.  No one answered left msg for patient to call back to reschedule.  Automatic phone system reminder called her on the 28th and she answered NO to coming to procedure.

## 2024-05-21 ENCOUNTER — Encounter (HOSPITAL_BASED_OUTPATIENT_CLINIC_OR_DEPARTMENT_OTHER): Payer: Self-pay | Admitting: Emergency Medicine

## 2024-05-21 ENCOUNTER — Emergency Department (HOSPITAL_BASED_OUTPATIENT_CLINIC_OR_DEPARTMENT_OTHER): Payer: MEDICAID | Admitting: Radiology

## 2024-05-21 ENCOUNTER — Emergency Department (HOSPITAL_BASED_OUTPATIENT_CLINIC_OR_DEPARTMENT_OTHER)
Admission: EM | Admit: 2024-05-21 | Discharge: 2024-05-21 | Disposition: A | Discharge status: Home / Self Care | Payer: MEDICAID

## 2024-05-21 ENCOUNTER — Other Ambulatory Visit: Payer: Self-pay

## 2024-05-21 DIAGNOSIS — Y9389 Activity, other specified: Secondary | ICD-10-CM | POA: Insufficient documentation

## 2024-05-21 DIAGNOSIS — M7989 Other specified soft tissue disorders: Secondary | ICD-10-CM | POA: Insufficient documentation

## 2024-05-21 DIAGNOSIS — Z23 Encounter for immunization: Secondary | ICD-10-CM | POA: Insufficient documentation

## 2024-05-21 DIAGNOSIS — S61211A Laceration without foreign body of left index finger without damage to nail, initial encounter: Secondary | ICD-10-CM | POA: Insufficient documentation

## 2024-05-21 DIAGNOSIS — W293XXA Contact with powered garden and outdoor hand tools and machinery, initial encounter: Secondary | ICD-10-CM | POA: Insufficient documentation

## 2024-05-21 HISTORY — DX: Essential (primary) hypertension: I10

## 2024-05-21 MED ORDER — LIDOCAINE HCL (PF) 1 % IJ SOLN
10.0000 mL | Freq: Once | INTRAMUSCULAR | Status: AC
  Administered 2024-05-21: 10 mL
  Filled 2024-05-21: qty 10

## 2024-05-21 MED ORDER — OXYCODONE-ACETAMINOPHEN 5-325 MG PO TABS
1.0000 | ORAL_TABLET | Freq: Once | ORAL | Status: AC
  Administered 2024-05-21: 1 via ORAL
  Filled 2024-05-21: qty 1

## 2024-05-21 MED ORDER — TETANUS-DIPHTH-ACELL PERTUSSIS 5-2.5-18.5 LF-MCG/0.5 IM SUSY
0.5000 mL | PREFILLED_SYRINGE | Freq: Once | INTRAMUSCULAR | Status: AC
  Administered 2024-05-21: 0.5 mL via INTRAMUSCULAR
  Filled 2024-05-21: qty 0.5

## 2024-05-21 MED ORDER — CEFAZOLIN SODIUM-DEXTROSE 2-4 GM/100ML-% IV SOLN
2.0000 g | Freq: Once | INTRAVENOUS | Status: AC
  Administered 2024-05-21: 2 g via INTRAVENOUS
  Filled 2024-05-21: qty 100

## 2024-05-21 MED ORDER — CEFADROXIL 500 MG PO CAPS: 500.0000 mg | ORAL_CAPSULE | Freq: Two times a day (BID) | ORAL | 0 refills | Status: DC

## 2024-05-21 NOTE — ED Provider Notes (Signed)
 [place] EMERGENCY DEPARTMENT AT [facility] Provider Note   CSN: [id number] Arrival date & time: 05/21/24  1235     History No chief complaint on file.   Pamela [name] is a 53 y.o. female presents emerged part today for evaluation of left index finger injury.  Patient reports that she was using a hedge tremor and accidentally grabbed the area shortly prior to arrival.  Unknown when her last tetanus was.  Does have some numbness tingling to the distal tip.  Denies any other injury.  Allergic to sulfa.  HPI     Home Medications Prior to Admission medications   Medication Sig Start Date End Date Taking? Authorizing Provider  lisinopril -hydrochlorothiazide  (ZESTORETIC ) 20-25 MG tablet Take 1 tablet by mouth daily. 06/24/23   Copland, Jolena Nay, MD      Allergies    Sulfa antibiotics    Review of Systems   Review of Systems  Skin:  Positive for wound.    Physical Exam Updated Vital Signs BP (!) 143/97   Pulse 85   Temp 98.2 F (36.8 C) (Oral)   Resp 16   LMP 03/09/2012 (LMP Unknown)   SpO2 99%  Physical Exam Vitals and nursing note reviewed.  Constitutional:      General: She is not in acute distress.    Appearance: She is not ill-appearing or toxic-appearing.  Eyes:     General: No scleral icterus. Cardiovascular:     Rate and Rhythm: Normal rate.  Pulmonary:     Effort: Pulmonary effort is normal. No respiratory distress.  Musculoskeletal:        General: Signs of injury present.     Comments: Please see attached images. The patient has an almost burst laceration present over the proximal phalanx of the left index finger. Brisk cap refill distally. She still has full ROM of the finger with an without resistance. Reports that she does have some numbness/tingling in the distal tip. Palpable radial pusle. Compartments are soft.   Skin:    General: Skin is warm and dry.  Neurological:     Mental Status: She is alert.         ED Results / Procedures /  Treatments   Labs (all labs ordered are listed, but only abnormal results are displayed) Labs Reviewed - No data to display  EKG None  Radiology DG Finger Index Left Result Date: 05/21/2024 CLINICAL DATA:  Injury with hedge trimmer. EXAM: LEFT INDEX FINGER 2+V COMPARISON:  None Available. FINDINGS: There is no evidence of fracture or dislocation. No erosions or periostitis. Minor degenerative spurring of the metacarpal phalangeal joint. Skin and soft tissue irregularity about the radial aspect of the proximal digit. No radiopaque foreign body peer IMPRESSION: Skin and soft tissue irregularity about the radial aspect of the proximal digit. No radiopaque foreign body. No fracture or subluxation. Electronically Signed   By: Chadwick Colonel M.D.   On: 05/21/2024 15:26    Procedures .Laceration Repair  Date/Time: 05/21/2024 5:23 PM  Performed by: Spence Dux, PA-C Authorized by: Spence Dux, PA-C   Consent:    Consent obtained:  Verbal   Consent given by:  Patient   Risks, benefits, and alternatives were discussed: yes     Risks discussed:  Infection, pain, nerve damage, need for additional repair, retained foreign body, poor cosmetic result, tendon damage and poor wound healing   Alternatives discussed:  No treatment and delayed treatment Universal protocol:    Procedure explained and questions answered to patient  or proxy's satisfaction: yes     Imaging studies available: yes     Patient identity confirmed:  Verbally with patient Laceration details:    Length (cm):  3 Exploration:    Contaminated: no   Treatment:    Area cleansed with:  Saline and Shur-Clens   Amount of cleaning:  Extensive   Irrigation solution:  Sterile water   Irrigation volume:  >2038mL Skin repair:    Repair method:  Sutures   Suture size:  5-0   Suture material:  Prolene   Suture technique:  Simple interrupted   Number of sutures:  15 Approximation:    Approximation:  Close Repair type:     Repair type:  Intermediate Post-procedure details:    Dressing:  Antibiotic ointment and splint for protection   Procedure completion:  Tolerated well, no immediate complications   Medications Ordered in ED Medications  ceFAZolin  (ANCEF ) IVPB 2g/100 mL premix (has no administration in time range)  lidocaine  (PF) (XYLOCAINE ) 1 % injection 10 mL (has no administration in time range)  Tdap (BOOSTRIX ) injection 0.5 mL (0.5 mLs Intramuscular Given 05/21/24 1439)  oxyCODONE -acetaminophen  (PERCOCET/ROXICET) 5-325 MG per tablet 1 tablet (1 tablet Oral Given 05/21/24 1439)    ED Course/ Medical Decision Making/ A&P   Medical Decision Making Amount and/or Complexity of Data Reviewed Radiology: ordered.  Risk Prescription drug management.   53 y.o. female presents to the ER for evaluation of finger laceration. Differential diagnosis includes but is not limited to open fracture, laceration. Vital signs elevated blood pressure otherwise unremarkable. Physical exam as noted above.   X-ray shows  Skin and soft tissue irregularity about the radial aspect of the proximal digit. No radiopaque foreign body. No fracture or subluxation. Per radiologist's interpretation.    On my own personal interpretation of the image, I question the may be some sort of bone chip around the area where the laceration is.  I do not see any bone or exposed fascia or muscle on wound evaluation and irrigation however an abundance of precaution we will get the patient to remove Ancef  to send her home with antibiotics as well.  Please see procedure note.  Wound was thoroughly irrigated and cleansed.  Bandages splint placed.  Will send patient home with antibiotics and hand surgery follow-up.  We discussed wound care and things to avoid and when to return for laceration.  She is stable for discharge home with close outpatient follow-up.  We discussed the results of the labs/imaging. The plan is care, antibiotics, follow-up with  hand surgery. We discussed strict return precautions and red flag symptoms. The patient verbalized their understanding and agrees to the plan. The patient is stable and being discharged home in good condition.  Portions of this report may have been transcribed using voice recognition software. Every effort was made to ensure accuracy; however, inadvertent computerized transcription errors may be present.    Final Clinical Impression(s) / ED Diagnoses Final diagnoses:  Laceration of left index finger without foreign body without damage to nail, initial encounter    Rx / DC Orders ED Discharge Orders          Ordered    cefadroxil  (DURICEF) 500 MG capsule  2 times daily        05/21/24 1735              Spence Dux, PA-C 05/25/24 1544    Carin Charleston, MD 05/31/24 1119

## 2024-05-21 NOTE — ED Notes (Signed)
 Index finger lac flushed with normal saline, dry dressing applied

## 2024-05-21 NOTE — Discharge Instructions (Signed)
 You were seen in the Emergency Department today for evaluation of your laceration. I am glad we were able to repair this for you. Please make sure that you are remembering to keep your wound clean daily with Dial soap and water and daily bandage changes. I recommend keeping the wound covered for the next 48-72 hours and then to your comfort afterwards. Do not expose the wound to any dishwater, pools, lakes, oceans, [employer], dirt or grime. Keeping the wound clean and away from contamination can help ensure good wound healing and help to prevent infections. You will need to return in 7-10 days for suture removal. This can be down at your primary care office, urgent care, or the ER.  For pain, I recommend Tylenol  1000mg  and/or ibuprofen  600mg  every 6 hours as needed for pain.  Please make sure to take your antibiotic as prescribed and complete the entirety of the course. Let your PCP know that we updated your tetanus shot for you today. Please call the hand surgeon for evaluation. If you have any concerns, new or worsening symptoms, please return to the nearest ER for re-evaluation.   Contact a doctor if: You got a tetanus shot and you have any of these problems where the needle went in: Swelling. Very bad pain. Redness. Bleeding. A wound that was closed breaks open. You have a fever. You have any of these signs of infection in your wound: More redness, swelling, or pain. Fluid or blood. Warmth. Pus or a bad smell. You see something coming out of the wound, such as wood or glass. Medicine does not make your pain go away. You notice a change in the color of your skin near your wound. You need to change the bandage often. You have a new rash. You lose feeling (have numbness) around the wound. Get help right away if: You have very bad swelling around the wound. Your pain suddenly gets worse and is very bad. You have painful lumps near the wound or on skin anywhere on your body. You have a red  streak going away from your wound. The wound is on your hand or foot, and: You cannot move a finger or toe. Your fingers or toes look pale or bluish.

## 2024-05-21 NOTE — ED Triage Notes (Signed)
 Pt cut left index finger while cutting shrubbery . Electric trimmers. No blood thinners

## 2024-05-26 ENCOUNTER — Telehealth: Payer: Self-pay

## 2024-05-26 NOTE — Transitions of Care (Post Inpatient/ED Visit) (Signed)
 pt seen [facility] ED on 05/21/24 lacerated L index finger with hedge trimmer. pt received stitches pt has seen hand surgeon and has FU appt next week with hand surgeon for suture removal. pt still taking abx and no signs or symptoms of infection. UC & ED precautions given if needed and pt voiced understanding. Pt received Tdap at ED.sending note to Dr Geralyn Knee,     05/26/2024  Name: Pamela [name] MRN: [id number] DOB: 1971/05/03  Today's TOC FU Call Status: Today's TOC FU Call Status:: Successful TOC FU Call Completed TOC FU Call Complete Date: 05/26/24 Patient's Name and Date of Birth confirmed.  Transition Care Management Follow-up Telephone Call Date of Discharge: 05/21/24 Discharge Facility: [facility] ([facility]) Type of Discharge: Emergency Department Reason for ED Visit: Other: (pt seen [facility] ED on 05/21/24 lacerated L index finger with hedge trimmer. pt received stitches pt has seen hand surgeon and has FU appt next week with hand surgeon for suture removal. pt still taking abx and no signs or symptoms of infection.) How have you been since you were released from the hospital?: Better Any questions or concerns?: No  Items Reviewed: Did you receive and understand the discharge instructions provided?: Yes Medications obtained,verified, and reconciled?: Yes (Medications Reviewed) Any new allergies since your discharge?: No Dietary orders reviewed?: NA Do you have support at home?: Yes People in Home [RPT]: child(ren), adult Name of Support/Comfort Primary Source: Bernetta Brilliant  Medications Reviewed Today: Medications Reviewed Today     Reviewed by Katheleen Palmer, LPN (Licensed Practical Nurse) on 05/26/24 at 0940  Med List Status: <None>   Medication Order Taking? Sig Documenting Provider Last Dose Status Informant  cefadroxil (DURICEF) 500 MG capsule [id number]  Take 1 capsule (500 mg total) by mouth 2 (two) times daily. Spence Dux, PA-C  Active    lisinopril -hydrochlorothiazide  (ZESTORETIC ) 20-25 MG tablet [id number]  Take 1 tablet by mouth daily. Copland, Jolena Nay, MD  Active             Home Care and Equipment/Supplies: Were Home Health Services Ordered?: NA Any new equipment or medical supplies ordered?: NA  Functional Questionnaire: Do you need assistance with bathing/showering or dressing?: No Do you need assistance with meal preparation?: No Do you need assistance with eating?: No Do you have difficulty maintaining continence: No Do you need assistance with getting out of bed/getting out of a chair/moving?: No Do you have difficulty managing or taking your medications?: No  Follow up appointments reviewed: PCP Follow-up appointment confirmed?: NA Specialist Hospital Follow-up appointment confirmed?: Yes Date of Specialist follow-up appointment?: 05/24/24 (pt has FU next wk with hand surgeon for suture removal,) Follow-Up Specialty Provider:: hand specialist Do you need transportation to your follow-up appointment?: No Do you understand care options if your condition(s) worsen?: Yes-patient verbalized understanding    SIGNATURE Claretha Crocker, LPN

## 2024-06-27 ENCOUNTER — Encounter: Payer: Self-pay | Admitting: Family Medicine

## 2024-06-27 ENCOUNTER — Telehealth (INDEPENDENT_AMBULATORY_CARE_PROVIDER_SITE_OTHER): Payer: Self-pay | Admitting: Family Medicine

## 2024-06-27 VITALS — Temp 98.3°F | Ht 67.5 in

## 2024-06-27 DIAGNOSIS — R52 Pain, unspecified: Secondary | ICD-10-CM

## 2024-06-27 DIAGNOSIS — A084 Viral intestinal infection, unspecified: Secondary | ICD-10-CM

## 2024-06-27 NOTE — Progress Notes (Signed)
 Pamela Zuercher T. Larence Thone, MD, CAQ Sports Medicine [facility] [address] [place], [address]  Phone: [phone]  FAX: [phone]  Pamela [name] - 53 y.o. female  MRN [id number]  Date of Birth: 1971/03/25  Date: 06/27/2024  PCP: Pamela Mirza, MD  Referral: Pamela Mirza, MD  Chief Complaint  Patient presents with   Diarrhea    Started on Friday-Just returned from [place] on July 1st No Covid Test   Chills        Generalized Body Aches        Abdominal Pain   Headache   Fatigue        Virtual Visit via Video Note:  I connected with  Pamela [name] on 06/27/2024 11:00 AM EDT by a video enabled telemedicine application and verified that I am speaking with the correct person using two identifiers.   Location patient: home computer, tablet, or smartphone Location provider: work or home office Consent: Verbal consent directly obtained from Pamela [name]. Persons participating in the virtual visit: patient, provider  I discussed the limitations of evaluation and management by telemedicine and the availability of in person appointments. The patient expressed understanding and agreed to proceed.  Chief Complaint  Patient presents with   Diarrhea    Started on Friday-Just returned from [place] on July 1st No Covid Test   Chills        Generalized Body Aches        Abdominal Pain   Headache   Fatigue         History of Present Illness:  Patient recently returned from a trip to [place] on July 1.  On Friday, she started to develop some prolific watery diarrhea going roughly 8-10 times a day since then.  Does seem to have lessened some today.  She has tried some Pepto-Bismol, she had some black coloration in her stools.  She has since stopped this and tried some Imodium last night, which did seem to slow up her diarrhea  She has had some diffuse bodyaches and headache. No upper respiratory symptoms or cough  Mom sick  on Thursday  Review of Systems as above: See pertinent positives and pertinent negatives per HPI No acute distress verbally   Observations/Objective/Exam:  An attempt was made to discern vital signs over the phone and per patient if applicable and possible.   General:    Alert, Oriented, appears well and in no acute distress  Pulmonary:     On inspection no signs of respiratory distress.  Psych / Neurological:     Pleasant and cooperative.  Assessment and Plan:    ICD-10-CM   1. Viral diarrhea  A08.4     2. Body aches  R52      For likely viral gastroenteritis, however COVID-19 cannot be excluded.  Recommended to the patient do a COVID test given increased risk and recent travel along with symptoms.  She is going to advance her diet as tolerated and continue with gentle hydration.  I would avoid return to work until diarrhea has stopped.  We also reviewed precautions if she does have COVID.  I discussed the assessment and treatment plan with the patient. The patient was provided an opportunity to ask questions and all were answered. The patient agreed with the plan and demonstrated an understanding of the instructions.   The patient was advised to call back or seek an in-person evaluation if the symptoms worsen or if  the condition fails to improve as anticipated.  Follow-up: prn unless noted otherwise below No follow-ups on file.  No orders of the defined types were placed in this encounter.  No orders of the defined types were placed in this encounter.   Signed,  Jacques DASEN. Lakara Weiland, MD
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
## 2024-07-13 ENCOUNTER — Encounter: Payer: Self-pay | Admitting: Family Medicine

## 2024-07-13 ENCOUNTER — Ambulatory Visit (INDEPENDENT_AMBULATORY_CARE_PROVIDER_SITE_OTHER): Payer: Self-pay | Admitting: Family Medicine

## 2024-07-13 VITALS — BP 145/95 | HR 97 | Temp 98.2°F | Ht 67.0 in | Wt 165.2 lb

## 2024-07-13 DIAGNOSIS — R5383 Other fatigue: Secondary | ICD-10-CM

## 2024-07-13 DIAGNOSIS — Z131 Encounter for screening for diabetes mellitus: Secondary | ICD-10-CM

## 2024-07-13 DIAGNOSIS — Z1211 Encounter for screening for malignant neoplasm of colon: Secondary | ICD-10-CM

## 2024-07-13 DIAGNOSIS — Z0000 Encounter for general adult medical examination without abnormal findings: Secondary | ICD-10-CM

## 2024-07-13 DIAGNOSIS — Z13220 Encounter for screening for lipoid disorders: Secondary | ICD-10-CM

## 2024-07-13 LAB — LIPID PANEL
Cholesterol: 206 mg/dL — ABNORMAL HIGH (ref 0–200)
HDL: 57.6 mg/dL (ref >39.00)
LDL Cholesterol: 138 mg/dL — ABNORMAL HIGH (ref 0–99)
NonHDL: 148.38
Total CHOL/HDL Ratio: 4
Triglycerides: 54 mg/dL (ref 0.0–149.0)
VLDL: 10.8 mg/dL (ref 0.0–40.0)

## 2024-07-13 LAB — BASIC METABOLIC PANEL WITH GFR
BUN: 7 mg/dL (ref 6–23)
CO2: 31 meq/L (ref 19–32)
Calcium: 9.5 mg/dL (ref 8.4–10.5)
Chloride: 103 meq/L (ref 96–112)
Creatinine, Ser: 0.95 mg/dL (ref 0.40–1.20)
GFR: 69.71 mL/min (ref >60.00)
Glucose, Bld: 83 mg/dL (ref 70–99)
Potassium: 4.9 meq/L (ref 3.5–5.1)
Sodium: 139 meq/L (ref 135–145)

## 2024-07-13 LAB — CBC WITH DIFFERENTIAL/PLATELET
Basophils Absolute: 0 K/uL (ref 0.0–0.1)
Basophils Relative: 0.8 % (ref 0.0–3.0)
Eosinophils Absolute: 0.1 K/uL (ref 0.0–0.7)
Eosinophils Relative: 1 % (ref 0.0–5.0)
HCT: 33.8 % — ABNORMAL LOW (ref 36.0–46.0)
Hemoglobin: 11.1 g/dL — ABNORMAL LOW (ref 12.0–15.0)
Lymphocytes Relative: 42.1 % (ref 12.0–46.0)
Lymphs Abs: 2.2 K/uL (ref 0.7–4.0)
MCHC: 32.8 g/dL (ref 30.0–36.0)
MCV: 86.9 fl (ref 78.0–100.0)
Monocytes Absolute: 0.6 K/uL (ref 0.1–1.0)
Monocytes Relative: 10.6 % (ref 3.0–12.0)
Neutro Abs: 2.4 K/uL (ref 1.4–7.7)
Neutrophils Relative %: 45.5 % (ref 43.0–77.0)
Platelets: 427 K/uL — ABNORMAL HIGH (ref 150.0–400.0)
RBC: 3.89 Mil/uL (ref 3.87–5.11)
RDW: 13.7 % (ref 11.5–15.5)
WBC: 5.3 K/uL (ref 4.0–10.5)

## 2024-07-13 LAB — HEPATIC FUNCTION PANEL
ALT: 13 U/L (ref 0–35)
AST: 15 U/L (ref 0–37)
Albumin: 3.9 g/dL (ref 3.5–5.2)
Alkaline Phosphatase: 60 U/L (ref 39–117)
Bilirubin, Direct: 0.1 mg/dL (ref 0.0–0.3)
Total Bilirubin: 0.3 mg/dL (ref 0.2–1.2)
Total Protein: 7.3 g/dL (ref 6.0–8.3)

## 2024-07-13 LAB — TSH: TSH: 0.86 u[IU]/mL (ref 0.35–5.50)

## 2024-07-13 LAB — HEMOGLOBIN A1C: Hgb A1c MFr Bld: 6.1 % (ref 4.6–6.5)

## 2024-07-13 MED ORDER — LISINOPRIL-HYDROCHLOROTHIAZIDE 20-12.5 MG PO TABS: 2.0000 | ORAL_TABLET | Freq: Every day | ORAL | 3 refills | Status: AC

## 2024-07-13 NOTE — Patient Instructions (Addendum)
 Start taking fiber supplement each day - Metamucil or Citrucel  For Hot flashes - Black Cohosh   You do not need a referral to make a mammogram appointment, and you may call to make her own mammogram appointment directly around your schedule.  [facility] [place] [phone] [address] [place], [place] [address]  [facility] ([facility]) [address] [place], [place] [address] Phone: [phone] Toll Free: [phone]  MAMMOGRAPHY IN [place]:  [facility] [place]) [phone] Located on the campus of [facility])  [facility] [place] [facility] Location) [address]  [place], [place] [address]

## 2024-07-15 ENCOUNTER — Ambulatory Visit: Payer: Self-pay | Admitting: Family Medicine

## 2024-07-24 NOTE — Progress Notes (Signed)
 GI consult for colonoscopy previously made.

## 2024-08-17 ENCOUNTER — Telehealth: Payer: Self-pay

## 2024-08-17 ENCOUNTER — Encounter: Payer: Self-pay | Admitting: Physician Assistant

## 2024-08-17 DIAGNOSIS — K140 Glossitis: Secondary | ICD-10-CM

## 2024-08-17 MED ORDER — LIDOCAINE VISCOUS HCL 2 % MT SOLN: 5.0000 mL | Freq: Three times a day (TID) | OROMUCOSAL | 0 refills | Status: AC | PRN

## 2024-08-17 NOTE — Patient Instructions (Signed)
  Pamela [name], thank you for joining Pamela Velma Lunger, Pamela [name] for today's virtual visit.  While this provider is not your primary care provider (PCP), if your PCP is located in our provider database this encounter information will be shared with them immediately following your visit.   A [facility] MyChart account gives you access to today's visit and all your visits, tests, and labs performed at [facility]  click here if you don't have a Pamela [name] MyChart account or go to [email or web address]  Consent: (Patient) Pamela [name] provided verbal consent for this virtual visit at the beginning of the encounter.  Current Medications:  Current Outpatient Medications:    lisinopril -hydrochlorothiazide  (ZESTORETIC ) 20-12.5 MG tablet, Take 2 tablets by mouth daily., Disp: 180 tablet, Rfl: 3   Medications ordered in this encounter:  No orders of the defined types were placed in this encounter.    *If you need refills on other medications prior to your next appointment, please contact your pharmacy*  Follow-Up: Call back or seek an in-person evaluation if the symptoms worsen or if the condition fails to improve as anticipated.  [facility] [phone]  Other Instructions Keep hydrated. Avoid hot or spicy foods, or foods with rough edges. Continue good oral hygiene. I recommend OTC Peroxyl mouth wash as well. I have sent in a prescription for a Magic Mouthwash to use as directed. Giving limitation of video exam, if not quickly resolving or you note any new symptoms, I want you to be evaluated in person either with your PCP or dental provider.   If you have been instructed to have an in-person evaluation today at a local Urgent Care facility, please use the link below. It will take you to a list of all of our available [place] [facility], including address, phone number and hours of operation. Please do not delay care.  [facility]  If  you or a family member do not have a primary care provider, use the link below to schedule a visit and establish care. When you choose a [facility] primary care physician or advanced practice provider, you gain a long-term partner in health. Find a Primary Care Provider  Learn more about [facility]'s in-office and virtual care options: [facility] - Get Care Now
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
